# Patient Record
Sex: Male | Born: 1995 | Race: White | Hispanic: No | Marital: Single | Smoking: Never smoker
Health system: Southern US, Community
[De-identification: ages and names within clinical notes are randomized; demographics above are authoritative.]

## PROBLEM LIST (undated history)

## (undated) DIAGNOSIS — F909 Attention-deficit hyperactivity disorder, unspecified type: Secondary | ICD-10-CM

## (undated) DIAGNOSIS — F649 Gender identity disorder, unspecified: Secondary | ICD-10-CM

## (undated) DIAGNOSIS — F419 Anxiety disorder, unspecified: Secondary | ICD-10-CM

## (undated) HISTORY — DX: Gender identity disorder, unspecified: F64.9

## (undated) HISTORY — DX: Attention-deficit hyperactivity disorder, unspecified type: F90.9

## (undated) HISTORY — PX: MASTECTOMY: SHX3

## (undated) HISTORY — DX: Anxiety disorder, unspecified: F41.9

---

## 2018-05-21 ENCOUNTER — Telehealth: Payer: Self-pay | Admitting: Family Medicine

## 2018-05-21 NOTE — Telephone Encounter (Signed)
Tony RushingSeth recently changed name from Maralyn SagoSarah to  SiloamSeth. He is wanting an appt with dr neal to begin testerone treatments. Please advise if dr neal will accept [place] as a pt

## 2018-05-21 NOTE — Telephone Encounter (Signed)
Dear Tony AstersWhite Team Please schedule Tony RushingSeth at next available slot in my clinic ---or you can schedule in colpo clinic is I am in that clinic [facility]! Denny LevySara Jazzmine Sharp

## 2018-05-22 NOTE — Telephone Encounter (Signed)
Contacted pt and [facility] Dr. Jennette KettleNeal, per Dr. Jennette KettleNeal. Joycelyn ManZimmerman Rumple, April D, CMA

## 2018-05-23 ENCOUNTER — Other Ambulatory Visit: Payer: Self-pay

## 2018-05-23 ENCOUNTER — Ambulatory Visit (INDEPENDENT_AMBULATORY_CARE_PROVIDER_SITE_OTHER): Payer: BLUE CROSS/BLUE SHIELD | Admitting: Family Medicine

## 2018-05-23 VITALS — BP 98/58 | HR 80 | Temp 97.8°F | Ht 67.0 in | Wt 173.0 lb

## 2018-05-23 DIAGNOSIS — F640 Transsexualism: Secondary | ICD-10-CM | POA: Diagnosis not present

## 2018-05-23 DIAGNOSIS — Z789 Other specified health status: Secondary | ICD-10-CM

## 2018-05-23 MED ORDER — SYRINGE (DISPOSABLE) 1 ML MISC: 2 refills | Status: DC

## 2018-05-23 MED ORDER — "NEEDLE (DISP) 25G X 5/8"" MISC": 3 refills | Status: DC

## 2018-05-23 MED ORDER — TESTOSTERONE CYPIONATE 200 MG/ML IM SOLN: INTRAMUSCULAR | 0 refills | Status: DC

## 2018-05-23 MED ORDER — "NEEDLE (DISP) 18G X 1-1/2"" MISC": 1 refills | Status: DC

## 2018-05-23 NOTE — Patient Instructions (Signed)
Great to meet you Let me see you in about 4 weeks. Please call with any problems or issues.

## 2018-05-24 DIAGNOSIS — Z789 Other specified health status: Secondary | ICD-10-CM | POA: Insufficient documentation

## 2018-05-24 DIAGNOSIS — F640 Transsexualism: Secondary | ICD-10-CM | POA: Insufficient documentation

## 2018-05-24 LAB — CMP14+EGFR
ALT: 18 IU/L (ref 0–32)
AST: 19 IU/L (ref 0–40)
Albumin/Globulin Ratio: 2.1 (ref 1.2–2.2)
Albumin: 4.7 g/dL (ref 3.5–5.5)
Alkaline Phosphatase: 56 IU/L (ref 39–117)
BUN/Creatinine Ratio: 11 (ref 9–23)
BUN: 7 mg/dL (ref 6–20)
Bilirubin Total: 0.3 mg/dL (ref 0.0–1.2)
CO2: 22 mmol/L (ref 20–29)
Calcium: 9.1 mg/dL (ref 8.7–10.2)
Chloride: 102 mmol/L (ref 96–106)
Creatinine, Ser: 0.62 mg/dL (ref 0.57–1.00)
GFR calc Af Amer: 149 mL/min/{1.73_m2} (ref >59)
GFR calc non Af Amer: 129 mL/min/{1.73_m2} (ref >59)
Globulin, Total: 2.2 g/dL (ref 1.5–4.5)
Glucose: 82 mg/dL (ref 65–99)
Potassium: 4 mmol/L (ref 3.5–5.2)
Sodium: 139 mmol/L (ref 134–144)
Total Protein: 6.9 g/dL (ref 6.0–8.5)

## 2018-05-24 LAB — LIPID PANEL
Chol/HDL Ratio: 2.8 ratio (ref 0.0–4.4)
Cholesterol, Total: 121 mg/dL (ref 100–199)
HDL: 44 mg/dL (ref >39)
LDL Calculated: 67 mg/dL (ref 0–99)
Triglycerides: 48 mg/dL (ref 0–149)
VLDL Cholesterol Cal: 10 mg/dL (ref 5–40)

## 2018-05-24 LAB — CBC
Hematocrit: 38.4 % (ref 34.0–46.6)
Hemoglobin: 13.1 g/dL (ref 11.1–15.9)
MCH: 30.4 pg (ref 26.6–33.0)
MCHC: 34.1 g/dL (ref 31.5–35.7)
MCV: 89 fL (ref 79–97)
Platelets: 336 10*3/uL (ref 150–450)
RBC: 4.31 x10E6/uL (ref 3.77–5.28)
RDW: 13.4 % (ref 12.3–15.4)
WBC: 4 10*3/uL (ref 3.4–10.8)

## 2018-05-24 LAB — ESTRADIOL: ESTRADIOL: 25.1 pg/mL

## 2018-05-24 NOTE — Assessment & Plan Note (Signed)
Long discussion regarding options.  He is definitely ready to initiate testosterone therapy.  We will get baseline labs today.  We instructed him in how to perform subcutaneous injection testosterone.  Prescriptions given.  Follow-up 1 month.

## 2018-05-24 NOTE — Progress Notes (Signed)
    CHIEF COMPLAINT / HPI:  new patient here for hormone therapy discussion and initiation for transgender. Has been in transition since 22 years of age but has not yet initiated any type of hormone therapy secondary to parental pressure.  He has been living with his family until recently when he went to college.  He is currently living with his boyfriend.  He is still on his parents insurance.  They have had many discussions and he says on the surface they are in agreement and understanding of his transition but they have been very reluctant for him to initiate hormone therapy.  He has decided it is definitely time.  He has been in counseling and therapy for several years.  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  This is been stable over the last few years.  He is doing well in school.  He is in a stable relationship.  REVIEW OF SYSTEMS: No fever, no unusual weight change.  No suicidal or homicidal ideation.  Stable.  PERTINENT  PMH / PSH: I have reviewed the patient's medications, allergies, past medical and surgical history, smoking status and updated in the EMR as appropriate.   OBJECTIVE:  Vital signs reviewed. GENERAL: Well-developed, well-nourished, no acute distress. CARDIOVASCULAR: Regular rate and rhythm no murmur gallop or rub LUNGS: Clear to auscultation bilaterally, no rales or wheeze. ABDOMEN: Soft positive bowel sounds NEURO: No gross focal neurological deficits. MSK: Movement of extremity x 4.    ASSESSMENT / PLAN: Please see problem oriented charting for details

## 2018-05-28 ENCOUNTER — Telehealth: Payer: Self-pay

## 2018-05-28 NOTE — Telephone Encounter (Signed)
Depo-Testosterone denied per CoverMyMeds:  "You do not meet the requirements of your plan. 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 Your request has been denied based on the information we have."  Letter in Dr. Donnetta HailNeal's box for review.  Ples SpecterAlisa Micharl Helmes, RN [facility] RN)

## 2018-05-28 NOTE — Telephone Encounter (Signed)
[facility] requesting prior authorization of Depo-Testosterone. Clinical notes uploaded via CoverMyMeds. Status-pending. Ples SpecterAlisa Tanita Palinkas, RN [facility])

## 2018-05-29 ENCOUNTER — Encounter: Payer: Self-pay | Admitting: Family Medicine

## 2018-06-19 ENCOUNTER — Encounter: Payer: Self-pay | Admitting: Family Medicine

## 2018-06-19 ENCOUNTER — Other Ambulatory Visit: Payer: Self-pay

## 2018-06-19 ENCOUNTER — Ambulatory Visit (INDEPENDENT_AMBULATORY_CARE_PROVIDER_SITE_OTHER): Payer: BLUE CROSS/BLUE SHIELD | Admitting: Family Medicine

## 2018-06-19 VITALS — BP 110/66 | HR 80 | Temp 98.5°F | Ht 67.0 in | Wt 174.6 lb

## 2018-06-19 DIAGNOSIS — F640 Transsexualism: Secondary | ICD-10-CM

## 2018-06-19 DIAGNOSIS — Z789 Other specified health status: Secondary | ICD-10-CM

## 2018-06-19 NOTE — Assessment & Plan Note (Signed)
Lab work today.  Will notify him of any changes needed for dosing.  Follow-up 3 m

## 2018-06-19 NOTE — Patient Instructions (Signed)
Great to see you! I will let you know about your labs. See me in about 2 months.

## 2018-06-19 NOTE — Progress Notes (Signed)
    CHIEF COMPLAINT / HPI: Follow-up starting testosterone.  Mild increase in acne on the face.  Mood has been a little bit up-and-down but nothing significant.  No problems with sleep other than baseline issues.  REVIEW OF SYSTEMS: No chest pain, no shortness of breath.  No lower extremity edema.  PERTINENT  PMH / PSH: I have reviewed the patient's medications, allergies, past medical and surgical history, smoking status and updated in the EMR as appropriate.   OBJECTIVE:  Vital signs reviewed. GENERAL: Well-developed, well-nourished, no acute distress. CARDIOVASCULAR: Regular rate and rhythm no murmur gallop or rub LUNGS: Clear to auscultation bilaterally, no rales or wheeze. ABDOMEN: Soft positive bowel sounds NEURO: No gross focal neurological deficits. MSK: Movement of extremity x 4.  Normal strength. SKIN: Mild acne on the forehead.    ASSESSMENT / PLAN: Please see problem oriented charting for details

## 2018-06-20 LAB — CBC
HEMATOCRIT: 38.6 % (ref 34.0–46.6)
HEMOGLOBIN: 12.8 g/dL (ref 11.1–15.9)
MCH: 29.7 pg (ref 26.6–33.0)
MCHC: 33.2 g/dL (ref 31.5–35.7)
MCV: 90 fL (ref 79–97)
Platelets: 327 10*3/uL (ref 150–450)
RBC: 4.31 x10E6/uL (ref 3.77–5.28)
RDW: 12 % — ABNORMAL LOW (ref 12.3–15.4)
WBC: 4.4 10*3/uL (ref 3.4–10.8)

## 2018-06-20 LAB — ESTRADIOL: Estradiol: 74.1 pg/mL

## 2018-06-20 LAB — TESTOSTERONE: Testosterone: 470 ng/dL — ABNORMAL HIGH (ref 8–48)

## 2018-06-21 ENCOUNTER — Telehealth: Payer: Self-pay | Admitting: Family Medicine

## 2018-06-21 DIAGNOSIS — F640 Transsexualism: Secondary | ICD-10-CM

## 2018-06-21 DIAGNOSIS — Z789 Other specified health status: Secondary | ICD-10-CM

## 2018-06-21 MED ORDER — TESTOSTERONE CYPIONATE 200 MG/ML IM SOLN: INTRAMUSCULAR | 0 refills | Status: DC

## 2018-06-21 NOTE — Telephone Encounter (Signed)
[phone] (H) Increase testosterone to 35 mg weekly Left vm mssg Denny LevySara Senica Crall  recheck 4 weeks

## 2018-07-16 ENCOUNTER — Other Ambulatory Visit: Payer: Self-pay | Admitting: *Deleted

## 2018-07-16 ENCOUNTER — Telehealth: Payer: Self-pay | Admitting: *Deleted

## 2018-07-16 DIAGNOSIS — Z789 Other specified health status: Secondary | ICD-10-CM

## 2018-07-16 DIAGNOSIS — F640 Transsexualism: Secondary | ICD-10-CM

## 2018-07-16 MED ORDER — TESTOSTERONE CYPIONATE 200 MG/ML IM SOLN: INTRAMUSCULAR | 3 refills | Status: DC

## 2018-07-16 NOTE — Telephone Encounter (Signed)
Received fax from pharmacy, PA needed on testosterone injection.  Clinical questions submitted via Cover My Meds.  Waiting on response, could take up to 72 hours.  Cover My Meds info: Key: [id number]  Odeal Welden, Maryjo RochesterJessica Dawn, CMA

## 2018-07-17 NOTE — Telephone Encounter (Signed)
Request Denied, see below:

## 2018-07-23 ENCOUNTER — Telehealth: Payer: Self-pay

## 2018-07-23 DIAGNOSIS — F640 Transsexualism: Secondary | ICD-10-CM

## 2018-07-23 DIAGNOSIS — Z789 Other specified health status: Secondary | ICD-10-CM

## 2018-07-23 MED ORDER — TESTOSTERONE CYPIONATE 200 MG/ML IM SOLN
INTRAMUSCULAR | 3 refills | Status: DC
Start: 2018-07-23 — End: 2019-02-17

## 2018-07-23 NOTE — Telephone Encounter (Signed)
Pt called nurse line stating his [facility], [facility] [address] [facility] medication availiable. Since this is a controlled substance, [facility]

## 2018-10-01 ENCOUNTER — Encounter: Payer: Self-pay | Admitting: Family Medicine

## 2018-10-01 ENCOUNTER — Ambulatory Visit (INDEPENDENT_AMBULATORY_CARE_PROVIDER_SITE_OTHER): Payer: BLUE CROSS/BLUE SHIELD | Admitting: Family Medicine

## 2018-10-01 VITALS — BP 128/72 | HR 94 | Temp 97.9°F | Ht 67.0 in | Wt 180.8 lb

## 2018-10-01 DIAGNOSIS — Z23 Encounter for immunization: Secondary | ICD-10-CM

## 2018-10-01 DIAGNOSIS — Z5181 Encounter for therapeutic drug level monitoring: Secondary | ICD-10-CM | POA: Diagnosis not present

## 2018-10-01 NOTE — Patient Instructions (Signed)
It was wonderful to see you today.  Thank you for choosing [facility].   Please call [phone] with any questions about today's appointment.  Please be sure to schedule follow up at the front  desk before you leave today. Follow up in 3 months.   Terisa Starr, MD  Family Medicine

## 2018-10-01 NOTE — Progress Notes (Signed)
  Patient Name: Tony Sharp Date of Birth: 06/23/1996 Date of Visit: 10/01/18 PCP: Nestor Ramp, MD  Chief Complaint: Monitoring of prescription medication  Subjective: Tony Sharp is a pleasant 22 y.o. year old presenting today for monitoring of testosterone therapy.  He reports overall he is doing well.  He is in his senior year at [employer].  He is studying psychology and theater (IT trainer).  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  He graduates this spring he is unsure what he wants to do after graduation quite yet.  In terms of the testosterone therapy Hulen pleased with his response so far.  He does still have some menstrual bleeding.  He has noticed changes in his voice as well as development of worsening acne.  He has been meeting his goals in terms of his transgender care he reports.  He is interested in top surgery in the future.  Currently he bindsup to 3 hours/day. He does not bind while he is at work.  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  Status is not sexually active currently.  He has never had intercourse.  We did discuss a Pap smear today in the future.  He will consider this.  He is not interested in testing for sexually transmitted infections or contraception given that he is not sexually active.  He describes himself as asexual. ROS:  ROS Negative for headaches, chest pain, difficulty breathing, constipation.  Positive for acne I have reviewed the patient's medical, surgical, family, and social history as appropriate.   Vitals:   10/01/18 1335  BP: 128/72  Pulse: 94  Temp: 97.9 F (36.6 C)  SpO2: 99%   Filed Weights   10/01/18 1335  Weight: 180 lb 12.8 oz (82 kg)   HEENT: Sclera anicteric. Dentition is moderate. Appears well hydrated. Neck: Supple Cardiac: Regular rate and rhythm. Normal S1/S2. No murmurs, rubs, or gallops appreciated. Lungs: Clear bilaterally to ascultation.  Skin: Warm, dry comedonal acne present Psych:  Pleasant and appropriate   Diagnoses and all orders for this visit:  Medication monitoring encounter -     Testosterone -     Comprehensive metabolic panel -     CBC  Need for immunization against influenza -     Flu Vaccine QUAD 36+ mos IM  HCM The patient does not currently have a primary care provider.  He would like to have Dr. Julienne Kass or the primary care provider.  We discussed healthcare maintenance including a Pap smear today.  He declined this as well as testing for sexually transmitted infections.  Terisa Starr, MD  Family Medicine Teaching Service

## 2018-10-02 ENCOUNTER — Telehealth: Payer: Self-pay | Admitting: Family Medicine

## 2018-10-02 LAB — COMPREHENSIVE METABOLIC PANEL
ALT: 13 IU/L (ref 0–32)
AST: 16 IU/L (ref 0–40)
Albumin/Globulin Ratio: 2.1 (ref 1.2–2.2)
Albumin: 5 g/dL (ref 3.5–5.5)
Alkaline Phosphatase: 76 IU/L (ref 39–117)
BUN/Creatinine Ratio: 10 (ref 9–23)
BUN: 9 mg/dL (ref 6–20)
Bilirubin Total: 0.4 mg/dL (ref 0.0–1.2)
CALCIUM: 9.7 mg/dL (ref 8.7–10.2)
CO2: 24 mmol/L (ref 20–29)
Chloride: 99 mmol/L (ref 96–106)
Creatinine, Ser: 0.86 mg/dL (ref 0.57–1.00)
GFR, EST AFRICAN AMERICAN: 112 mL/min/{1.73_m2} (ref >59)
GFR, EST NON AFRICAN AMERICAN: 97 mL/min/{1.73_m2} (ref >59)
GLUCOSE: 96 mg/dL (ref 65–99)
Globulin, Total: 2.4 g/dL (ref 1.5–4.5)
POTASSIUM: 4.3 mmol/L (ref 3.5–5.2)
Sodium: 139 mmol/L (ref 134–144)
TOTAL PROTEIN: 7.4 g/dL (ref 6.0–8.5)

## 2018-10-02 LAB — TESTOSTERONE: TESTOSTERONE: 422 ng/dL — AB (ref 8–48)

## 2018-10-02 LAB — CBC
HEMATOCRIT: 41.4 % (ref 34.0–46.6)
HEMOGLOBIN: 13.7 g/dL (ref 11.1–15.9)
MCH: 29.2 pg (ref 26.6–33.0)
MCHC: 33.1 g/dL (ref 31.5–35.7)
MCV: 88 fL (ref 79–97)
Platelets: 323 10*3/uL (ref 150–450)
RBC: 4.69 x10E6/uL (ref 3.77–5.28)
RDW: 13.1 % (ref 12.3–15.4)
WBC: 7.8 10*3/uL (ref 3.4–10.8)

## 2018-10-02 NOTE — Telephone Encounter (Signed)
Patient aware. Alisa Brake, RN ([facility] RN)  

## 2018-10-02 NOTE — Telephone Encounter (Signed)
Called patient with results. Left HIPAA Compliant voice mail.   If calls back, labs are within normal limits. Would continue current dose of medication- levels are in perfect range.

## 2019-02-17 ENCOUNTER — Other Ambulatory Visit: Payer: Self-pay

## 2019-02-17 ENCOUNTER — Encounter: Payer: Self-pay | Admitting: Family Medicine

## 2019-02-17 ENCOUNTER — Ambulatory Visit (INDEPENDENT_AMBULATORY_CARE_PROVIDER_SITE_OTHER): Payer: BLUE CROSS/BLUE SHIELD | Admitting: Family Medicine

## 2019-02-17 VITALS — BP 110/72 | HR 93 | Temp 98.5°F | Wt 188.0 lb

## 2019-02-17 DIAGNOSIS — Z789 Other specified health status: Secondary | ICD-10-CM

## 2019-02-17 DIAGNOSIS — F640 Transsexualism: Secondary | ICD-10-CM | POA: Diagnosis not present

## 2019-02-17 DIAGNOSIS — Z5181 Encounter for therapeutic drug level monitoring: Secondary | ICD-10-CM

## 2019-02-17 MED ORDER — TESTOSTERONE CYPIONATE 200 MG/ML IM SOLN: INTRAMUSCULAR | 3 refills | Status: DC

## 2019-02-17 MED ORDER — TESTOSTERONE CYPIONATE 200 MG/ML IM SOLN
INTRAMUSCULAR | 3 refills | Status: DC
Start: 2019-02-17 — End: 2019-02-17

## 2019-02-17 NOTE — Patient Instructions (Addendum)
It was wonderful to see you today.  Thank you for choosing [facility].   Please call [phone] with any questions about today's appointment.  Please be sure to schedule follow up at the front  desk before you leave today.   Terisa Starr, MD  Family Medicine   Please schedule follow up in 6 months  I will call you with labs  Thank you for seeing me today!!

## 2019-02-17 NOTE — Progress Notes (Signed)
  Patient Name: Tony Sharp Date of Birth: Dec 18, 1996 Date of Visit: 02/17/19 PCP: Nestor Ramp, MD  Chief Complaint: medication monitoring   Subjective: Tony Sharp is a pleasant 23 y.o. presenting today for medication refill.   Brain reports he will graduate in May. Just returned from a job fair. 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   Patient reports he missed a dose of testosterone and got his period. He has had no other issues. No problems with cost, headaches, side effects. He is only binding ~10 hours per week now. Not sexually active. No tobacco or excess alcohol use.   In addition to testosterone, he takes Buspar, Cymbalta, and Strattera.   ROS: as above.  ROS  I have reviewed the patient's medical, surgical, family, and social history as appropriate.   Vitals:   02/17/19 1033  BP: 110/72  Pulse: 93  Temp: 98.5 F (36.9 C)  SpO2: 97%   Filed Weights   02/17/19 1033  Weight: 188 lb (85.3 kg)   HEENT: Sclera anicteric. Dentition is moderate. Appears well hydrated. Neck: Supple Cardiac: Regular rate and rhythm. Normal S1/S2. No murmurs, rubs, or gallops appreciated. Lungs: Clear bilaterally to ascultation.  Abdomen: Normoactive bowel sounds. No tenderness to deep or light palpation. No rebound or guarding.  Extremities: Warm, well perfused without edema.  Skin: Warm, dry, moderate acne  Psych: Pleasant and appropriate   [name] was seen today for testosterone check.  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 Reviewed possible long term side effects. Reviewed monitoring parameters below. Recommended Pap and STI testing in future, the patient will think about this.  -     Comprehensive metabolic panel -     CBC -     Testosterone -     testosterone cypionate (DEPOTESTOSTERONE CYPIONATE) 200 MG/ML injection; Inject 35 mg subcutaneously each  week  Terisa Starr, MD  [facility] Medicine Teaching Service

## 2019-02-17 NOTE — Assessment & Plan Note (Signed)
Injects on Wednesday---will check levels today. Continue current dose, monitoring labs ordered.

## 2019-02-18 ENCOUNTER — Telehealth: Payer: Self-pay | Admitting: Family Medicine

## 2019-02-18 DIAGNOSIS — Z789 Other specified health status: Secondary | ICD-10-CM

## 2019-02-18 DIAGNOSIS — F640 Transsexualism: Secondary | ICD-10-CM

## 2019-02-18 LAB — CBC
Hematocrit: 42.1 % (ref 34.0–46.6)
Hemoglobin: 14.3 g/dL (ref 11.1–15.9)
MCH: 30 pg (ref 26.6–33.0)
MCHC: 34 g/dL (ref 31.5–35.7)
MCV: 88 fL (ref 79–97)
Platelets: 338 10*3/uL (ref 150–450)
RBC: 4.76 x10E6/uL (ref 3.77–5.28)
RDW: 13.2 % (ref 11.7–15.4)
WBC: 5 10*3/uL (ref 3.4–10.8)

## 2019-02-18 LAB — COMPREHENSIVE METABOLIC PANEL
ALBUMIN: 4.8 g/dL (ref 3.9–5.0)
ALT: 13 IU/L (ref 0–32)
AST: 20 IU/L (ref 0–40)
Albumin/Globulin Ratio: 2 (ref 1.2–2.2)
Alkaline Phosphatase: 78 IU/L (ref 39–117)
BUN / CREAT RATIO: 12 (ref 9–23)
BUN: 11 mg/dL (ref 6–20)
Bilirubin Total: 0.3 mg/dL (ref 0.0–1.2)
CALCIUM: 9.5 mg/dL (ref 8.7–10.2)
CO2: 23 mmol/L (ref 20–29)
CREATININE: 0.9 mg/dL (ref 0.57–1.00)
Chloride: 100 mmol/L (ref 96–106)
GFR calc Af Amer: 106 mL/min/{1.73_m2} (ref >59)
GFR, EST NON AFRICAN AMERICAN: 92 mL/min/{1.73_m2} (ref >59)
GLUCOSE: 72 mg/dL (ref 65–99)
Globulin, Total: 2.4 g/dL (ref 1.5–4.5)
Potassium: 4.9 mmol/L (ref 3.5–5.2)
Sodium: 141 mmol/L (ref 134–144)
Total Protein: 7.2 g/dL (ref 6.0–8.5)

## 2019-02-18 LAB — TESTOSTERONE: Testosterone: 319 ng/dL — ABNORMAL HIGH (ref 8–48)

## 2019-02-18 MED ORDER — TESTOSTERONE CYPIONATE 200 MG/ML IM SOLN: INTRAMUSCULAR | 3 refills | Status: DC

## 2019-02-18 NOTE — Telephone Encounter (Signed)
Called patient regarding results. Total testosterone below desired range. Discussed with patient, will increase to 40 mg weekly (injects on Wednesday, new dose 0.2 mL weekly).   Will recheck level in 3 months.  All questions answered. New Rx to [facility].   Terisa Starr, MD  Family Medicine Teaching Service

## 2019-03-03 ENCOUNTER — Telehealth: Payer: Self-pay

## 2019-03-03 MED ORDER — DULOXETINE HCL 60 MG PO CPEP: 60.0000 mg | ORAL_CAPSULE | Freq: Every day | ORAL | 0 refills | Status: DC

## 2019-03-03 NOTE — Telephone Encounter (Signed)
Called patient to check in--he is doing okay. Rx sent to pharmacy.   Terisa Starr, MD  Family Medicine Teaching Service

## 2019-03-03 NOTE — Telephone Encounter (Signed)
Pt called nurse line stating his phychiatrist has recently switched practices and with COVID 19, he is not wanting to go to a new place right now. Pt is requesting a temporary prescriptions for his Cymbalta generic 60mg . Pt stated she only has #15 left as of today. Pt takes this medication 1x daily. Pt does want to see someone else, just right now is not a good time. Please advise.   Pt requests the medication refill be sent to [facility], since he saw her last.

## 2019-04-21 ENCOUNTER — Other Ambulatory Visit: Payer: Self-pay

## 2019-04-22 MED ORDER — DULOXETINE HCL 60 MG PO CPEP: 60.0000 mg | ORAL_CAPSULE | Freq: Every day | ORAL | 0 refills | Status: DC

## 2019-05-16 ENCOUNTER — Telehealth: Payer: Self-pay | Admitting: Family Medicine

## 2019-05-16 NOTE — Telephone Encounter (Signed)
Called patient to check in---- Tony Sharp is due for a testosterone level sometime this month. Left voicemail to call back.   Lab should be collected on either Monday or Friday while fasting.   Terisa Starr, MD  Family Medicine Teaching Service

## 2019-09-15 ENCOUNTER — Other Ambulatory Visit: Payer: Self-pay | Admitting: *Deleted

## 2019-09-15 DIAGNOSIS — F640 Transsexualism: Secondary | ICD-10-CM

## 2019-09-15 DIAGNOSIS — Z789 Other specified health status: Secondary | ICD-10-CM

## 2019-09-15 MED ORDER — TESTOSTERONE CYPIONATE 200 MG/ML IM SOLN: INTRAMUSCULAR | 0 refills | Status: DC

## 2019-09-15 NOTE — Telephone Encounter (Signed)
Pt states that he is out of med and he is afraid that his testosterone level will be off when he come to have lab work next week.  Christen Bame, CMA

## 2019-09-15 NOTE — Telephone Encounter (Signed)
I refilled meds Come to appt anyway THANKS! Dorcas Mcmurray

## 2019-09-15 NOTE — Addendum Note (Signed)
Addended byDorcas Mcmurray L on: 09/15/2019 03:55 PM   Modules accepted: Orders

## 2019-09-15 NOTE — Telephone Encounter (Signed)
Pt informed of below.Tony Sharp, CMA ? ?

## 2019-09-18 ENCOUNTER — Telehealth: Payer: Self-pay | Admitting: *Deleted

## 2019-09-18 NOTE — Telephone Encounter (Signed)
Received fax from pharmacy, PA needed on testosterone injection.  Clinical questions submitted via Cover My Meds.  Waiting on response, could take up to 72 hours.  Cover My Meds info: Key: [id number]  Christen Bame, CMA

## 2019-09-22 ENCOUNTER — Ambulatory Visit: Payer: BLUE CROSS/BLUE SHIELD | Admitting: Family Medicine

## 2019-09-22 ENCOUNTER — Other Ambulatory Visit: Payer: Self-pay

## 2019-09-22 ENCOUNTER — Encounter: Payer: Self-pay | Admitting: Family Medicine

## 2019-09-22 VITALS — BP 144/82 | HR 110

## 2019-09-22 DIAGNOSIS — Z5181 Encounter for therapeutic drug level monitoring: Secondary | ICD-10-CM

## 2019-09-22 DIAGNOSIS — Z23 Encounter for immunization: Secondary | ICD-10-CM | POA: Diagnosis not present

## 2019-09-22 DIAGNOSIS — F640 Transsexualism: Secondary | ICD-10-CM

## 2019-09-22 NOTE — Patient Instructions (Signed)
It was wonderful to see you today.  Thank you for choosing [facility].   Please call [phone] with any questions about today's appointment.  Please be sure to schedule follow up at the front  desk before you leave today.   Dorris Singh, MD  Family Medicine    I will call you with lab results

## 2019-09-22 NOTE — Progress Notes (Addendum)
  Patient Name: Tony Sharp Date of Birth: Jun 19, 1996 Date of Visit: 09/22/19 PCP: Dickie La, MD  Chief Complaint: Medication refill  Subjective: Tony Sharp is a pleasant 23 y.o. 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  Patient over all reports he is doing well.  Since I last saw him in the late winter he has obtained a job at a [employer].  He likes working there, most of the time outside  He says his most bothersome concern currently is his voice.  He constantly is Ms. gendered on the phone.  Additionally he describes his body is "too curvy".  He has not had any side effects from the testosterone.  He is growing facial hair he has noticed a deepening of his voice but wishes to have more changed with this.  He denies any spotting headaches, chest pain.  He does have some mild acne but he reports this is not bothersome.  The patient reports his mood is overall doing well.  He is in an established relationship with Hartford Poli (his partner of 18 months).  He lives with 2 roommates.  He feels safe where he lives and likes his job currently.    ROS: Per HPI.   I have reviewed the patient's medical, surgical, family, and social history as appropriate.  Vitals:   09/22/19 0854  BP: (!) 144/82  Pulse: (!) 110  SpO2: 98%   BP Repeat 122/80 HR 96   HEENT: Sclera anicteric. Dentition is moderate. Appears well hydrated. Neck: Supple Cardiac: Regular rate and rhythm. Normal S1/S2. No murmurs, rubs, or gallops appreciated. Lungs: Clear bilaterally to ascultation.  Psych: Pleasant and appropriate, voice does have feminine overtones after talking for longer sentences.  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  The patient would like deeper voice and and more masculine body features.  Blood pressure on repeat was within normal limits.  Will obtain labs to evaluate  for potential side effects.  He currently takes 40 mg typically on Fridays although he most recently injected last Monday which was the 28th. At follow-up consider further discussion of Pap CBR.  As well we will further discuss that surgical interventions (mastectomy or breast reduction) may help patient achieve goals Offered STI screening, declined.   Return to care in 3 months.   Dorris Singh, MD  Family Medicine Teaching Service

## 2019-09-22 NOTE — Assessment & Plan Note (Signed)
Monitoring labs obtained today.  The patient would like deeper voice and and more masculine body features.  Blood pressure on repeat was within normal limits.  Will obtain labs to evaluate for potential side effects.  He currently takes 40 mg typically on Fridays although he most recently injected last Monday which was the 28th.

## 2019-09-23 LAB — ESTRADIOL: Estradiol: 30.5 pg/mL

## 2019-09-23 LAB — COMPREHENSIVE METABOLIC PANEL
ALT: 16 IU/L (ref 0–32)
AST: 19 IU/L (ref 0–40)
Albumin/Globulin Ratio: 1.9 (ref 1.2–2.2)
Albumin: 4.7 g/dL (ref 3.9–5.0)
Alkaline Phosphatase: 112 IU/L (ref 39–117)
BUN/Creatinine Ratio: 11 (ref 9–23)
BUN: 10 mg/dL (ref 6–20)
Bilirubin Total: 0.2 mg/dL (ref 0.0–1.2)
CO2: 23 mmol/L (ref 20–29)
Calcium: 9.5 mg/dL (ref 8.7–10.2)
Chloride: 101 mmol/L (ref 96–106)
Creatinine, Ser: 0.87 mg/dL (ref 0.57–1.00)
GFR calc Af Amer: 109 mL/min/{1.73_m2} (ref >59)
GFR calc non Af Amer: 95 mL/min/{1.73_m2} (ref >59)
Globulin, Total: 2.5 g/dL (ref 1.5–4.5)
Glucose: 66 mg/dL (ref 65–99)
Potassium: 4.4 mmol/L (ref 3.5–5.2)
Sodium: 140 mmol/L (ref 134–144)
Total Protein: 7.2 g/dL (ref 6.0–8.5)

## 2019-09-23 LAB — TESTOSTERONE: Testosterone: 177 ng/dL — ABNORMAL HIGH (ref 8–48)

## 2019-09-23 LAB — LIPID PANEL
Chol/HDL Ratio: 3.4 ratio (ref 0.0–4.4)
Cholesterol, Total: 158 mg/dL (ref 100–199)
HDL: 46 mg/dL (ref >39)
LDL Chol Calc (NIH): 93 mg/dL (ref 0–99)
Triglycerides: 101 mg/dL (ref 0–149)
VLDL Cholesterol Cal: 19 mg/dL (ref 5–40)

## 2019-09-23 LAB — CBC
Hematocrit: 44.7 % (ref 34.0–46.6)
Hemoglobin: 15 g/dL (ref 11.1–15.9)
MCH: 30.2 pg (ref 26.6–33.0)
MCHC: 33.6 g/dL (ref 31.5–35.7)
MCV: 90 fL (ref 79–97)
Platelets: 325 10*3/uL (ref 150–450)
RBC: 4.97 x10E6/uL (ref 3.77–5.28)
RDW: 13.3 % (ref 11.7–15.4)
WBC: 5.6 10*3/uL (ref 3.4–10.8)

## 2019-09-24 ENCOUNTER — Telehealth: Payer: Self-pay | Admitting: Family Medicine

## 2019-09-24 DIAGNOSIS — F640 Transsexualism: Secondary | ICD-10-CM

## 2019-09-24 DIAGNOSIS — F649 Gender identity disorder, unspecified: Secondary | ICD-10-CM

## 2019-09-24 DIAGNOSIS — Z789 Other specified health status: Secondary | ICD-10-CM

## 2019-09-24 NOTE — Telephone Encounter (Signed)
Attempted to call with results. Will increase testosterone to 50 mg.   Dorris Singh, MD  Family Medicine Teaching Service

## 2019-09-25 MED ORDER — TESTOSTERONE CYPIONATE 200 MG/ML IM SOLN: INTRAMUSCULAR | 0 refills | Status: DC

## 2019-09-25 NOTE — Telephone Encounter (Signed)
Call patient regarding blood work.  Testosterone is low.  Increase to 50 mg once a week.  Will recheck labs again November.  All questions were answered

## 2019-09-25 NOTE — Telephone Encounter (Signed)
Pt LVM on nurse line calling for Dr. Owens Shark. Pt received message about increasing medication but would like to talk to Dr. Owens Shark. Please call [phone]. Ottis Stain, CMA

## 2019-09-30 ENCOUNTER — Telehealth: Payer: Self-pay

## 2019-09-30 NOTE — Telephone Encounter (Signed)
Received fax from [facility] requesting prior authorization for Testosterone. Clincal questions placed in providers box for completion. Please return to RN clinic once completed.   Covermymeds Key: [id number]

## 2019-10-24 ENCOUNTER — Other Ambulatory Visit: Payer: BC Managed Care – PPO

## 2019-10-24 ENCOUNTER — Other Ambulatory Visit: Payer: Self-pay

## 2019-10-24 DIAGNOSIS — F649 Gender identity disorder, unspecified: Secondary | ICD-10-CM

## 2019-10-25 LAB — TESTOSTERONE: Testosterone: 418 ng/dL — ABNORMAL HIGH (ref 8–48)

## 2019-11-16 ENCOUNTER — Encounter: Payer: Self-pay | Admitting: Family Medicine

## 2019-11-16 DIAGNOSIS — F649 Gender identity disorder, unspecified: Secondary | ICD-10-CM

## 2019-11-17 MED ORDER — TESTOSTERONE CYPIONATE 200 MG/ML IM SOLN: INTRAMUSCULAR | 0 refills | Status: DC

## 2020-02-21 ENCOUNTER — Ambulatory Visit: Payer: BC Managed Care – PPO | Attending: Internal Medicine

## 2020-02-21 DIAGNOSIS — Z23 Encounter for immunization: Secondary | ICD-10-CM | POA: Insufficient documentation

## 2020-02-21 NOTE — Progress Notes (Signed)
   Covid-19 Vaccination Clinic  Name:  Tony Sharp    MRN: [id number] DOB: 03/19/1996  02/21/2020  [name] was observed post Covid-19 immunization for 15 minutes without incident. He was provided with Vaccine Information Sheet and instruction to access the V-Safe system.   [name] was instructed to call 911 with any severe reactions post vaccine: Marland Kitchen Difficulty breathing  . Swelling of face and throat  . A fast heartbeat  . A bad rash all over body  . Dizziness and weakness   Immunizations Administered    Name Date Dose VIS Date Route   Pfizer COVID-19 Vaccine 02/21/2020 10:31 AM 0.3 mL 11/28/2019 Intramuscular   Manufacturer: [employer], [employer]   Lot: [id number]   NDC: [address]-4

## 2020-03-16 ENCOUNTER — Ambulatory Visit: Payer: BC Managed Care – PPO | Attending: Internal Medicine

## 2020-03-16 DIAGNOSIS — Z23 Encounter for immunization: Secondary | ICD-10-CM

## 2020-03-16 NOTE — Progress Notes (Signed)
   Covid-19 Vaccination Clinic  Name:  TABARI VOLKERT    MRN: [id number] DOB: 08/01/1996  03/16/2020  Mr. Moradi was observed post Covid-19 immunization for 15 minutes without incident. He was provided with Vaccine Information Sheet and instruction to access the V-Safe system.   Mr. Apo was instructed to call 911 with any severe reactions post vaccine: Marland Kitchen Difficulty breathing  . Swelling of face and throat  . A fast heartbeat  . A bad rash all over body  . Dizziness and weakness   Immunizations Administered    Name Date Dose VIS Date Route   Pfizer COVID-19 Vaccine 03/16/2020  8:17 AM 0.3 mL 11/28/2019 Intramuscular   Manufacturer: [employer], [employer]   Lot: [id number]   NDC: [address]-4

## 2020-04-30 ENCOUNTER — Other Ambulatory Visit: Payer: Self-pay | Admitting: *Deleted

## 2020-04-30 DIAGNOSIS — F649 Gender identity disorder, unspecified: Secondary | ICD-10-CM

## 2020-05-06 MED ORDER — TESTOSTERONE CYPIONATE 200 MG/ML IM SOLN: INTRAMUSCULAR | 0 refills | Status: DC

## 2020-05-07 ENCOUNTER — Telehealth: Payer: Self-pay

## 2020-05-07 NOTE — Telephone Encounter (Signed)
Received fax from pharmacy, PA needed on Testosterone. Clinical questions submitted via Cover My Meds. Waiting on response, could take up to 72 hours.  Cover My Meds info: Key: [id number]

## 2020-05-10 NOTE — Telephone Encounter (Signed)
Medication was denied. Please see below. Typically you should receive a detailed letter with a phone number to appeal this, if you wish to appeal.

## 2020-06-14 ENCOUNTER — Encounter: Payer: Self-pay | Admitting: Family Medicine

## 2020-06-14 ENCOUNTER — Other Ambulatory Visit: Payer: Self-pay

## 2020-06-14 ENCOUNTER — Ambulatory Visit: Payer: BC Managed Care – PPO | Admitting: Family Medicine

## 2020-06-14 VITALS — BP 122/80 | HR 106 | Wt 228.0 lb

## 2020-06-14 DIAGNOSIS — M25562 Pain in left knee: Secondary | ICD-10-CM

## 2020-06-14 DIAGNOSIS — F640 Transsexualism: Secondary | ICD-10-CM | POA: Diagnosis not present

## 2020-06-14 DIAGNOSIS — Z789 Other specified health status: Secondary | ICD-10-CM

## 2020-06-14 DIAGNOSIS — G4700 Insomnia, unspecified: Secondary | ICD-10-CM

## 2020-06-14 DIAGNOSIS — M25862 Other specified joint disorders, left knee: Secondary | ICD-10-CM | POA: Diagnosis not present

## 2020-06-14 DIAGNOSIS — G8929 Other chronic pain: Secondary | ICD-10-CM

## 2020-06-14 NOTE — Progress Notes (Addendum)
    SUBJECTIVE:   CHIEF COMPLAINT / HPI:   Tony Sharp is a 24 y.o. male with a history of anxiety who presents for a testosterone check-up.  He has several concerns today.  He reports lifelong issues with sleep.  He takes melatonin and Unisom every single night.  He typically ends work around 5 PM.  He then goes home gets in bed at 11 PM and tries to fall asleep by midnight.  Oftentimes he does not fall asleep until 5 AM.  When he wakes up at 7 for work.  He drinks 2 cups of caffeine per day.  His room is cool and relatively quiet with lots of white noise.  No TV.  And he tries to stay off his phone.  He has a family history of thyroid disease.  He reports at night oftentimes his legs bother him.  He does not get his menses.  The patient reports he is having medial left knee pain.  This is ongoing for several years.  It is worsened since he is now working [employer].  He denies popping, clicking, catching.  Denies prior injury.  He has tried nothing for this.  He notices it when he is going up and down stairs and when he crosses his leg.  He recently started running again and noticed this worsening.  He denies swelling.  He notices improvement with rest.  The patient is injecting 60 mg of testosterone.  Last injected last Friday.  He has no specific concerns and no side effects.  He would consider Pap smear in the future but not right now.  He is not currently sexually active.  He is undergoing top surgery later this year. Tony Sharp- Dr. Leonor Liv)   PERTINENT  PMH / PSH: Reviewed and updated.   OBJECTIVE:   BP 122/80   Pulse (!) 106   Wt 228 lb (103.4 kg)   SpO2 97%   BMI 35.71 kg/m   HR on exam is 94  HEENT: Sclera anicteric. Dentition is moderate. Appears well hydrated. +moderate acne  Neck: Supple no thyromegaly  Cardiac: Regular rate and rhythm. Normal S1/S2. No murmurs, rubs, or gallops appreciated. Lungs: Clear bilaterally to ascultation.  Knee exam KNEE  Is  ligamentously intact to:        Varus stress        Valgus stress        Anterior drawer / Lachman testing There is NO effusion Gait + intoeing Negative thessaly  No TTP over pes medial bursa PES   ASSESSMENT/PLAN:   Left knee pain Suspect patellofemoral pain syndrome.  Less likely meniscal pathology given examination.  Also considered Pez anserine bursitis but he has no point tenderness over the bursa and examination is not consistent with this.  Intoeing is likely contributing to this could consider orthotics in the future.  Male-to-male transgender person No adverse side effects.  He is planning for top surgery later this year.  Congratulated on this change.  Monitoring labs today.  Insomnia Reviewed sleep hygiene. May represent RLS, less likely. OSA also consider. + FHx of thyroid disease---will check CBC, Ferritin, TSH today. Consider counseling for iCBTin future, query if anxiety contributing. GAD at follow up if continues.    HCM Discussed Pap Already got COVID vaccine Declined HIV and Hep C   [name], MD [facility]

## 2020-06-14 NOTE — Patient Instructions (Addendum)
It was wonderful to see you today.  Please bring ALL of your medications with you to every visit.     Thank you for choosing [facility].   Please call [phone] with any questions about today's appointment.  Please be sure to schedule follow up at the front  desk before you leave today.   Terisa Starr, MD  Family Medicine   I will call your pharmacy about the testosterone  I will call you with labs  Thank you for talking with [place] today  Stretches---- 10 reps of each exercise every single day for 1 week then can go to every other day   Lets follow up in about 4 weeks for your sleeping

## 2020-06-14 NOTE — Assessment & Plan Note (Signed)
No adverse side effects.  He is planning for top surgery later this year.  Congratulated on this change.  Monitoring labs today.

## 2020-06-14 NOTE — Assessment & Plan Note (Signed)
Reviewed sleep hygiene. May represent RLS, less likely. OSA also consider. + FHx of thyroid disease---will check CBC, Ferritin, TSH today. Consider counseling for iCBTin future, query if anxiety contributing. GAD at follow up if continues.

## 2020-06-14 NOTE — Assessment & Plan Note (Signed)
Suspect patellofemoral pain syndrome.  Less likely meniscal pathology given examination.  Also considered Pez anserine bursitis but he has no point tenderness over the bursa and examination is not consistent with this.  Intoeing is likely contributing to this could consider orthotics in the future.

## 2020-06-15 ENCOUNTER — Telehealth: Payer: Self-pay | Admitting: Family Medicine

## 2020-06-15 LAB — CBC
Hematocrit: 47.7 % — ABNORMAL HIGH (ref 34.0–46.6)
Hemoglobin: 16 g/dL — ABNORMAL HIGH (ref 11.1–15.9)
MCH: 30.7 pg (ref 26.6–33.0)
MCHC: 33.5 g/dL (ref 31.5–35.7)
MCV: 91 fL (ref 79–97)
Platelets: 358 10*3/uL (ref 150–450)
RBC: 5.22 x10E6/uL (ref 3.77–5.28)
RDW: 12.7 % (ref 11.7–15.4)
WBC: 6.2 10*3/uL (ref 3.4–10.8)

## 2020-06-15 LAB — COMPREHENSIVE METABOLIC PANEL
ALT: 19 IU/L (ref 0–32)
AST: 20 IU/L (ref 0–40)
Albumin/Globulin Ratio: 1.7 (ref 1.2–2.2)
Albumin: 4.8 g/dL (ref 3.9–5.0)
Alkaline Phosphatase: 129 IU/L — ABNORMAL HIGH (ref 48–121)
BUN/Creatinine Ratio: 15 (ref 9–23)
BUN: 13 mg/dL (ref 6–20)
Bilirubin Total: 0.4 mg/dL (ref 0.0–1.2)
CO2: 21 mmol/L (ref 20–29)
Calcium: 9.6 mg/dL (ref 8.7–10.2)
Chloride: 102 mmol/L (ref 96–106)
Creatinine, Ser: 0.87 mg/dL (ref 0.57–1.00)
GFR calc Af Amer: 109 mL/min/{1.73_m2} (ref >59)
GFR calc non Af Amer: 94 mL/min/{1.73_m2} (ref >59)
Globulin, Total: 2.8 g/dL (ref 1.5–4.5)
Glucose: 75 mg/dL (ref 65–99)
Potassium: 4.5 mmol/L (ref 3.5–5.2)
Sodium: 138 mmol/L (ref 134–144)
Total Protein: 7.6 g/dL (ref 6.0–8.5)

## 2020-06-15 LAB — TESTOSTERONE: Testosterone: 332 ng/dL — ABNORMAL HIGH (ref 13–71)

## 2020-06-15 LAB — FERRITIN: Ferritin: 30 ng/mL (ref 15–150)

## 2020-06-15 LAB — TSH: TSH: 3.31 u[IU]/mL (ref 0.450–4.500)

## 2020-06-15 NOTE — Telephone Encounter (Signed)
Left generic voicemail about normal results, also sent mychart.  Repeat ALP at follow up.  Terisa Starr, MD  Family Medicine Teaching Service

## 2020-07-09 ENCOUNTER — Other Ambulatory Visit: Payer: Self-pay

## 2020-07-09 ENCOUNTER — Ambulatory Visit: Payer: BC Managed Care – PPO | Admitting: Family Medicine

## 2020-07-09 ENCOUNTER — Encounter: Payer: Self-pay | Admitting: Family Medicine

## 2020-07-09 VITALS — BP 108/72 | HR 75 | Ht 67.0 in | Wt 227.0 lb

## 2020-07-09 DIAGNOSIS — R748 Abnormal levels of other serum enzymes: Secondary | ICD-10-CM

## 2020-07-09 DIAGNOSIS — F5101 Primary insomnia: Secondary | ICD-10-CM

## 2020-07-09 DIAGNOSIS — G2581 Restless legs syndrome: Secondary | ICD-10-CM | POA: Diagnosis not present

## 2020-07-09 MED ORDER — GABAPENTIN 100 MG PO CAPS: 100.0000 mg | ORAL_CAPSULE | Freq: Every evening | ORAL | 0 refills | Status: DC

## 2020-07-09 NOTE — Assessment & Plan Note (Signed)
Suspect restless leg as cause of symptoms--- evaluation for secondary causes unrevealing. Will complete evaluation with B12 and folate today. Reviewed options, encouraged hydration, trial gabapentin at night.

## 2020-07-09 NOTE — Patient Instructions (Signed)
It was wonderful to see you today.  Please bring ALL of your medications with you to every visit.   Today we talked about:  - Starting gabapentin for your legs  - Checking your liver testing again  Message me in 4-6 weeks about your legs  You can increase the gabapentin to up to 3 tabs at night    Thank you for choosing [facility].   Please call [phone] with any questions about today's appointment.  Please be sure to schedule follow up at the front  desk before you leave today.   Terisa Starr, MD  Family Medicine

## 2020-07-09 NOTE — Progress Notes (Signed)
    SUBJECTIVE:   CHIEF COMPLAINT / HPI:  Tony Sharp is a 24 year old presenting for follow up he has several concerns today.  Leg issue at night  The patient continues to report insomnia.  He reports his legs constantly want to move at night.  He feels like he does has to move his legs every several minutes at night.  He drinks about 8 glasses of water while at work during the day and 1 to 2 cups of caffeine.  He has been trying to establish a sleep hygiene pattern and sleep routine over the last several weeks.  He has been off Unisom and is found his leg symptoms are worsening.  He does not drink alcohol in excess.  His ferritin level is within normal limits.  Elevated alkaline phosphatase The patient had elevated alkaline phosphatase on his last metabolic panel.  He denies excess alcohol use.  He is not taking any medications on a regular basis other than testosterone therapy.  Denies right upper quadrant pain or back pain.  Left knee pain He reports he has been watching this.  He does notice it worsening at times and going up and down stairs is similar going both up and down.  When he crosses his legs.  No numbness or weakness.  He is interested in considering physical therapy in the future and will message about this if he would like to proceed.  PERTINENT  PMH / PSH/Family/Social History : Updated as appropriate  OBJECTIVE:   BP 108/72   Pulse 75   Ht 5\' 7"  (1.702 m)   Wt (!) 227 lb (103 kg)   SpO2 97%   BMI 35.55 kg/m   Cardiac: Regular rate and rhythm. Normal S1/S2. No murmurs, rubs, or gallops appreciated. Lungs: Clear bilaterally to ascultation.      ASSESSMENT/PLAN:   Insomnia Suspect restless leg as cause of symptoms--- evaluation for secondary causes unrevealing. Will complete evaluation with B12 and folate today. Reviewed options, encouraged hydration, trial gabapentin at night.     Elevated ALP Repeat with GGT today.  We discussed etiology at  length   Knee pain, improved, will message if recurs   , MD  Family Medicine Teaching Service  [facility]

## 2020-07-10 LAB — FOLATE: Folate: 13.3 ng/mL (ref >3.0)

## 2020-07-10 LAB — HEPATIC FUNCTION PANEL
ALT: 17 IU/L (ref 0–44)
AST: 18 IU/L (ref 0–40)
Albumin: 4.7 g/dL (ref 4.1–5.2)
Alkaline Phosphatase: 121 IU/L (ref 48–121)
Bilirubin Total: 0.5 mg/dL (ref 0.0–1.2)
Bilirubin, Direct: 0.14 mg/dL (ref 0.00–0.40)
Total Protein: 7.4 g/dL (ref 6.0–8.5)

## 2020-07-10 LAB — GAMMA GT: GGT: 15 IU/L (ref 0–65)

## 2020-07-10 LAB — VITAMIN B12: Vitamin B-12: 202 pg/mL — ABNORMAL LOW (ref 232–1245)

## 2020-07-13 ENCOUNTER — Telehealth: Payer: Self-pay | Admitting: Family Medicine

## 2020-07-13 DIAGNOSIS — E538 Deficiency of other specified B group vitamins: Secondary | ICD-10-CM

## 2020-07-13 MED ORDER — CYANOCOBALAMIN 1000 MCG PO CAPS: ORAL_CAPSULE | ORAL | 3 refills | Status: DC

## 2020-07-13 NOTE — Telephone Encounter (Signed)
Called patient and per request left voicemail with normal results of liver function. Asked him to call back about low B12---also sent MyChart.   Rx already at [facility] one table once per day. Needs repeat level in 6-8 weeks. This can just be lab visit.   Terisa Starr, MD  Family Medicine Teaching Service

## 2020-07-27 ENCOUNTER — Encounter: Payer: Self-pay | Admitting: Family Medicine

## 2020-07-27 MED ORDER — GABAPENTIN 100 MG PO CAPS: 200.0000 mg | ORAL_CAPSULE | Freq: Every evening | ORAL | 2 refills | Status: DC

## 2020-08-06 ENCOUNTER — Telehealth: Payer: Self-pay | Admitting: *Deleted

## 2020-08-06 NOTE — Telephone Encounter (Signed)
Received fax from pharmacy, PA needed on testosterone.  Clinical questions submitted via Cover My Meds.  Waiting on response, could take up to 72 hours.  Cover My Meds info: Key: [id number]  Jone Baseman, CMA

## 2020-08-10 ENCOUNTER — Encounter: Payer: Self-pay | Admitting: Family Medicine

## 2020-08-13 ENCOUNTER — Ambulatory Visit: Payer: BC Managed Care – PPO | Admitting: Family Medicine

## 2020-08-13 ENCOUNTER — Other Ambulatory Visit: Payer: Self-pay

## 2020-08-13 ENCOUNTER — Encounter: Payer: Self-pay | Admitting: Family Medicine

## 2020-08-13 VITALS — BP 106/80 | HR 78 | Ht 67.0 in | Wt 225.0 lb

## 2020-08-13 DIAGNOSIS — F5101 Primary insomnia: Secondary | ICD-10-CM

## 2020-08-13 DIAGNOSIS — M67479 Ganglion, unspecified ankle and foot: Secondary | ICD-10-CM

## 2020-08-13 MED ORDER — GABAPENTIN 100 MG PO CAPS: 300.0000 mg | ORAL_CAPSULE | Freq: Every evening | ORAL | 2 refills | Status: DC

## 2020-08-13 NOTE — Patient Instructions (Signed)
It was very nice to meet you today. Please enjoy the rest of your week.   Today you were seen for cysts on your feet. They are likely ganglion cyst and do not need to be surgically removed unless they are bothering you.  Consider wearing looser shoes.   I have increased your gabapentin to 300 mg nightly.   Follow up in 2 weeks for follow up visit with Dr. Manson Passey or sooner if needed.   Please call the clinic at [phone] if your symptoms worsen or you have any concerns. It was our pleasure to serve you.

## 2020-08-13 NOTE — Progress Notes (Signed)
    SUBJECTIVE:   CHIEF COMPLAINT / HPI:   [name] is a 24 yo M who presents for the issue below  Cyst on foot Noticed swelling on top of right foot increasing. It is not painful. He wants to make sure it does not need surgery at this time. 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 Endorses that he does tie his shoes tight and feels the "bump" rubbing up against it.   Restless leg syndrome, Insomnia Gabapentin increase at last visit with Dr. Manson Passey to 200 mg was having some relief with this increase but symptoms are returning now.   PERTINENT  PMH / PSH: As above.  OBJECTIVE:   BP 106/80   Pulse 78   Ht 5\' 7"  (1.702 m)   Wt 225 lb (102.1 kg)   SpO2 99%   BMI 35.24 kg/m   General: Appears well, no acute distress. Age appropriate. Cardiac: RRR, normal heart sounds, no murmurs Respiratory: CTAB, normal effort       ASSESSMENT/PLAN:   Ganglion cyst of foot Acute. Bilateral. Painless. Fluctuant mass. Discoloration suggest pressure likely from shoes. Not surgical management at this time.  -Wear loose fitting shoes -Follow up as needed of if increasing in size  Insomnia -Increase gabapentin to 300mg  qhs -Obtain b12 at follow up with Dr. 08/25/20   Tony Friendly, DO [facility]

## 2020-08-18 DIAGNOSIS — M67479 Ganglion, unspecified ankle and foot: Secondary | ICD-10-CM | POA: Insufficient documentation

## 2020-08-18 NOTE — Assessment & Plan Note (Addendum)
-  Increase gabapentin to 300mg  qhs -Obtain b12 at follow up with Dr. 08/25/20

## 2020-08-18 NOTE — Assessment & Plan Note (Addendum)
Acute. Bilateral. Painless. Fluctuant mass. Discoloration suggest pressure likely from shoes. Not surgical management at this time.  -Wear loose fitting shoes -Follow up as needed of if increasing in size

## 2020-08-23 ENCOUNTER — Encounter: Payer: Self-pay | Admitting: Family Medicine

## 2020-08-24 NOTE — Progress Notes (Signed)
° ° °  SUBJECTIVE:   CHIEF COMPLAINT / HPI: Follow-up feet and B12 level  Tony Sharp is a 24 year old gentleman presenting to discuss the following:  B12 deficiency: Level 202 on 07/09/2020.  He was started on B12 1000 mcg daily.  Endorses compliance.  Restless leg syndrome: Chronic for several years, present since high school.  Currently on gabapentin 300 mg nightly, seems to help for the first few days of increased dosage and then does not seem to help much.  He has not tried any other medications for this.  Bilateral cyst on feet: Seem to be getting smaller after not tying shoes as tight.  Not itchy or painful, not particularly bothersome just wanted to make sure everything is okay.  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  Mild ecchymoses present over each place without any surrounding erythema, puslike drainage, or warmth to touch.  Full ROM of ankle joint with normal gait.  See picture in media from office visit on 9/1.  ASSESSMENT/PLAN:   Restless leg syndrome Chronic, poorly controlled.  Ferritin WNL in 05/2020.  History of B12 deficiency may be contributing, will recheck level today after 1 month of supplementation.  Encouraged maintaining hydration and stretching prior to bedtime.  Recommended also trying a glass of tonic water prior to bed with gabapentin as is.  Could consider transition of medication if not improving, such as ropinirole, on follow-up.   Vitamin B12 deficiency Recheck vitamin B12 level today.   Has been on 1000 mcg daily.  Ganglion cyst of foot Subacute, improving with looser fit of shoes.  Reassuringly otherwise not bothersome to the patient.  Recommended continued conservative measures and follow-up if increasing in size, associated pain, weakness, or any numbness/tingling in region.    Follow-up in 1 month with Dr. 06/2020 or sooner if needed.  Manson Passey, DO [facility]

## 2020-08-25 ENCOUNTER — Encounter: Payer: Self-pay | Admitting: Family Medicine

## 2020-08-25 ENCOUNTER — Ambulatory Visit: Payer: BC Managed Care – PPO | Admitting: Family Medicine

## 2020-08-25 ENCOUNTER — Other Ambulatory Visit: Payer: Self-pay

## 2020-08-25 VITALS — BP 118/80 | HR 94 | Ht 67.0 in | Wt 227.6 lb

## 2020-08-25 DIAGNOSIS — M67479 Ganglion, unspecified ankle and foot: Secondary | ICD-10-CM | POA: Diagnosis not present

## 2020-08-25 DIAGNOSIS — E538 Deficiency of other specified B group vitamins: Secondary | ICD-10-CM | POA: Diagnosis not present

## 2020-08-25 DIAGNOSIS — G2581 Restless legs syndrome: Secondary | ICD-10-CM | POA: Diagnosis not present

## 2020-08-25 NOTE — Patient Instructions (Addendum)
It was wonderful meeting you today.   For your restless leg, please make sure that you are drinking plenty water during the day and try to stay physically active.  Try stretching your legs prior to bedtime.  Additionally you could try drinking a glass of tonic water before bed that can be very helpful with any leg cramps or restless leg in addition to your gabapentin.  If this has not made any improvement, you could discuss transition of medication with Dr. Manson Passey.   Keep trying to wear your shoes a little looser as you have been doing.  These will hopefully get better with time, if you start having severe pain associated with them or any new numbness or weakness with your feet please come back right away.  We will recheck your B12 level today.

## 2020-08-25 NOTE — Assessment & Plan Note (Signed)
Chronic, poorly controlled.  Ferritin WNL in 05/2020.  History of B12 deficiency may be contributing, will recheck level today after 1 month of supplementation.  Encouraged maintaining hydration and stretching prior to bedtime.  Recommended also trying a glass of tonic water prior to bed with gabapentin as is.  Could consider transition of medication if not improving, such as ropinirole, on follow-up.

## 2020-08-25 NOTE — Assessment & Plan Note (Signed)
Subacute, improving with looser fit of shoes.  Reassuringly otherwise not bothersome to the patient.  Recommended continued conservative measures and follow-up if increasing in size, associated pain, weakness, or any numbness/tingling in region.

## 2020-08-25 NOTE — Assessment & Plan Note (Signed)
Recheck vitamin B12 level today.  Has been on 1000 mcg daily.

## 2020-08-26 LAB — VITAMIN B12: Vitamin B-12: 553 pg/mL (ref 232–1245)

## 2020-10-20 ENCOUNTER — Telehealth: Payer: Self-pay

## 2020-10-20 NOTE — Telephone Encounter (Signed)
Received fax from pharmacy, PA needed on Testosterone. Clinical questions submitted via Cover My Meds. Waiting on response, could take up to 72 hours.  Cover My Meds info: Key: [id number]

## 2020-10-22 ENCOUNTER — Other Ambulatory Visit: Payer: Self-pay | Admitting: Family Medicine

## 2020-10-22 DIAGNOSIS — F5101 Primary insomnia: Secondary | ICD-10-CM

## 2020-11-08 ENCOUNTER — Encounter: Payer: Self-pay | Admitting: Family Medicine

## 2020-11-08 DIAGNOSIS — F649 Gender identity disorder, unspecified: Secondary | ICD-10-CM

## 2020-11-08 MED ORDER — TESTOSTERONE CYPIONATE 200 MG/ML IM SOLN: INTRAMUSCULAR | 0 refills | Status: DC

## 2020-12-22 ENCOUNTER — Encounter: Payer: Self-pay | Admitting: Family Medicine

## 2020-12-22 ENCOUNTER — Other Ambulatory Visit: Payer: Self-pay | Admitting: *Deleted

## 2020-12-22 DIAGNOSIS — F5101 Primary insomnia: Secondary | ICD-10-CM

## 2020-12-22 MED ORDER — GABAPENTIN 100 MG PO CAPS: 300.0000 mg | ORAL_CAPSULE | Freq: Every evening | ORAL | 2 refills | Status: DC

## 2020-12-22 NOTE — Telephone Encounter (Signed)
Refilled.   Terisa Starr, MD  Family Medicine Teaching Service

## 2021-02-15 ENCOUNTER — Ambulatory Visit: Payer: BC Managed Care – PPO | Admitting: Family Medicine

## 2021-02-15 ENCOUNTER — Other Ambulatory Visit (HOSPITAL_COMMUNITY)
Admission: RE | Admit: 2021-02-15 | Discharge: 2021-02-15 | Disposition: A | Discharge status: Home / Self Care | Payer: BC Managed Care – PPO | Source: Ambulatory Visit | Attending: Family Medicine | Admitting: Family Medicine

## 2021-02-15 ENCOUNTER — Other Ambulatory Visit: Payer: Self-pay

## 2021-02-15 DIAGNOSIS — R300 Dysuria: Secondary | ICD-10-CM | POA: Insufficient documentation

## 2021-02-15 DIAGNOSIS — N3091 Cystitis, unspecified with hematuria: Secondary | ICD-10-CM | POA: Diagnosis not present

## 2021-02-15 DIAGNOSIS — F5101 Primary insomnia: Secondary | ICD-10-CM

## 2021-02-15 DIAGNOSIS — F322 Major depressive disorder, single episode, severe without psychotic features: Secondary | ICD-10-CM

## 2021-02-15 LAB — POCT WET PREP (WET MOUNT)
Clue Cells Wet Prep Whiff POC: NEGATIVE
Trichomonas Wet Prep HPF POC: ABSENT
WBC, Wet Prep HPF POC: >20

## 2021-02-15 MED ORDER — DULOXETINE HCL 60 MG PO CPEP: 60.0000 mg | ORAL_CAPSULE | Freq: Two times a day (BID) | ORAL | 0 refills | Status: AC

## 2021-02-15 MED ORDER — GABAPENTIN 100 MG PO CAPS: 600.0000 mg | ORAL_CAPSULE | Freq: Every day | ORAL | 2 refills | Status: DC

## 2021-02-15 NOTE — Progress Notes (Addendum)
    SUBJECTIVE:   CHIEF COMPLAINT / HPI: having difficulty urinating  Kyi reports that symptoms started Feb 11 and he took an outpatient test that was positive for leukocytes.  He call [facility] and he was prescribed Nitrofurantoin and completed a 5 day course.  He reports that symptoms went away for a couple of days then returned.  He then too another test which was still positive for Leukocytes and he again called [facility] who prescribed Cephalexin for 5 day. He completed this course and reports that symptoms resolved until yesterday. He started Urostat which has not helped.  Reports continues dysuria,feeling sensation to void and hematuria.  Has never had menses secondary to testosterone injection.  Denies any fevers, chills, back pain, vaginal itching or burning.  Positive PHQ9 Denies any active SI/HI today.  Follows with psychiatry.  Mood is good.  PERTINENT  PMH / PSH:   OBJECTIVE:   BP 130/79   Pulse (!) 108   Ht 5\' 7"  (1.702 m)   Wt 242 lb (109.8 kg)   SpO2 97%   BMI 37.90 kg/m    General: Alert, no acute distress Abdomen: Bowel sounds normal. Abdomen soft and non-tender.  Pelvic Exam chaperoned by CMA Shari         External: normal male genitalia without lesions or masses        Vagina: erythema and atrophic vaginal walls        Cervix: normal without lesions or masses          ASSESSMENT/PLAN:   Cystitis with hematuria Failed two courses of antibiotic therapy for UTI.  Reports positive home test for Leukocytes.  Continues to have LUTI symptoms with hematuria.  Given that he has not had menses differentials for hematuria include bladder stones, nephrolithiasis less likely given no back pain or radiation to groin. Also concerning for cervicitis given burning sensation. -PAP, GC&C today -urinalysis and urine culture -renal u/s to ensure no hydronephrosis -If culture positive will treat. -OK to start Amoxicillin for gum graft that was performed and prescribed by different  provider yesterday -Follow up with PCP as needed  MDD (major depressive disorder) Positive PHQ 9.  Follows with psychiatry.  No active SI/HI plan -Continue to follow with psych -Follow up with PCP as needed     , MD [facility]

## 2021-02-15 NOTE — Patient Instructions (Addendum)
Thank you for coming to see me today. It was a pleasure.   Your urine sample sent out for culture.  I will call you with the results and if you need treatment will send a prescription to your pharmacy.  I have placed an order for renal ultrasound.  Please go to [facility] or at [facility] to have this completed.  You do not need an appointment, but if you would like to call them beforehand, their number is [phone].  We will contact you with your results afterwards.  Please follow-up with PCP as follow  If you have any questions or concerns, please do not hesitate to call the office at [phone].  Best,   Dana Allan, MD  Bladder Stone  A bladder stone is a buildup of crystals made from the proteins and minerals found in urine. These substances build up when urine becomes too concentrated. Urine is concentrated when there is less water and more proteins and minerals in it. Bladder stones usually develop when a person has another medical condition that prevents the bladder from emptying completely. Crystals can form in the small amount of urine that is left in the bladder. Bladder stones that grow large can become painful and may block the flow of urine. What are the causes? This condition may be caused by:  An enlarged prostate, which prevents the bladder from emptying well.  An infection of a part of your urinary system (urinary tract infection, or UTI). This includes the: ? Kidneys. ? Bladder. ? Ureters. These are the tubes that carry urine to your bladder. ? Urethra. This is the tube that drains urine from your bladder.  A weak spot in the bladder that creates a small pouch (bladder diverticulum).  Nerve damage that may interfere with the signals from your brain to your bladder muscles (neurogenic bladder). This can result from conditions such as Parkinson's disease or spinal cord injuries. What increases the risk? This condition is  more likely to develop in people who:  Get frequent UTIs.  Have another medical condition that affects the bladder.  Have a history of bladder surgery.  Have a spinal cord injury.  Have an abnormal shape of the bladder (deformity). What are the signs or symptoms? Common symptoms of this condition include:  Pain in the abdomen.  A need to urinate more often.  Difficulty or pain when urinating.  Blood in the urine.  Cloudy urine or urine that is dark in color.  Pain in the penis or testicles in men. Small bladder stones do not always cause symptoms. How is this diagnosed? This condition may be diagnosed based on your symptoms, medical history, and physical exam. The physical exam will check for tenderness in your abdomen. For men, an exam in the rectum may be done to check the prostate gland.  You may have tests, such as: ? A urine test (urinalysis). ? A urine sample test to check for other infections (culture). ? Blood tests, including tests to look for a certain substance (creatinine). A creatinine level that is higher than normal could indicate a blockage. ? A procedure to check your bladder using a scope with a camera (cystoscopy).  You may also have imaging studies, such as: ? CT scan or ultrasound of your abdomen and the area between your hip bones (pelvis or pelvic area). ? An X-ray of your urinary system. How is this treated? This condition may be treated with:  Cystolitholapaxy. This  procedure uses a laser, ultrasound, or other device to break the stone into smaller pieces. Fluids are used to flush the small pieces from the area.  Surgery to remove the stone.  A stent. This is a small mesh tube that is threaded into your ureter to make urine flow.  Medicines to treat pain. Follow these instructions at home: Medicines  Take over-the-counter and prescription medicines only as told by your health care provider.  Ask your health care provider if the medicine  prescribed to you: ? Requires you to avoid driving or using heavy machinery. ? Can cause constipation. You may need to take these actions to prevent or treat constipation:  Take over-the-counter or prescription medicines.  Eat foods that are high in fiber, such as beans, whole grains, and fresh fruits and vegetables.  Limit foods that are high in fat and processed sugars, such as fried or sweet foods. Alcohol use  Do not drink alcohol if: ? Your health care provider tells you not to drink. ? You are pregnant, may be pregnant, or are planning to become pregnant.  If you drink alcohol: ? Limit how much you drink to:  0-1 drink a day for women.  0-2 drinks a day for men. ? Be aware of how much alcohol is in your drink. In the [place] one drink equals one 12 oz bottle of beer (355 mL), one 5 oz glass of wine (148 mL), or one 1 oz glass of hard liquor (44 mL). Activity  Rest as told by your health care provider.  Return to your normal activities as told by your health care provider. Ask your health care provider what activities are safe for you. General instructions  Drink enough fluid to keep your urine pale yellow.  Tell your health care provider about any unusual symptoms related to urinating. Early diagnosis of an enlarged prostate and other bladder conditions may reduce your risk of getting bladder stones.  Do not use any products that contain nicotine or tobacco, such as cigarettes, e-cigarettes, or chewing tobacco. If you need help quitting, ask your health care provider.  Do not use drugs.   Where to find more information [facility]): www.urologyhealth.org Contact a health care provider if you:  Have a fever.  Feel nauseous or vomit.  Are unable to urinate.  Have a large amount of blood in your urine. Get help right away if you:  Have severe back pain or pain in the lower part of your abdomen.  Cannot eat or drink without vomiting.  Vomit after  taking your medicine. Summary  A bladder stone is a buildup of crystals made from the proteins and minerals found in urine. These substances build up when urine becomes too concentrated.  Bladder stones that grow large can become painful and may block the flow of urine.  Bladder stones may be treated with a laser, a stent, surgery, or pain medicines. This information is not intended to replace advice given to you by your health care provider. Make sure you discuss any questions you have with your health care provider. Document Revised: 06/26/2019 Document Reviewed: 06/26/2019 Elsevier Patient Education  2021 [employer].

## 2021-02-16 ENCOUNTER — Encounter: Payer: Self-pay | Admitting: Family Medicine

## 2021-02-16 DIAGNOSIS — N3091 Cystitis, unspecified with hematuria: Secondary | ICD-10-CM | POA: Insufficient documentation

## 2021-02-16 DIAGNOSIS — N3090 Cystitis, unspecified without hematuria: Secondary | ICD-10-CM | POA: Insufficient documentation

## 2021-02-16 DIAGNOSIS — F329 Major depressive disorder, single episode, unspecified: Secondary | ICD-10-CM | POA: Insufficient documentation

## 2021-02-16 LAB — CBC
Hematocrit: 44.8 % (ref 37.5–51.0)
Hemoglobin: 15.3 g/dL (ref 13.0–17.7)
MCH: 30.4 pg (ref 26.6–33.0)
MCHC: 34.2 g/dL (ref 31.5–35.7)
MCV: 89 fL (ref 79–97)
Platelets: 358 10*3/uL (ref 150–450)
RBC: 5.03 x10E6/uL (ref 4.14–5.80)
RDW: 12.4 % (ref 11.6–15.4)
WBC: 5.6 10*3/uL (ref 3.4–10.8)

## 2021-02-16 LAB — BASIC METABOLIC PANEL
BUN/Creatinine Ratio: 10 (ref 9–20)
BUN: 9 mg/dL (ref 6–20)
CO2: 24 mmol/L (ref 20–29)
Calcium: 9.5 mg/dL (ref 8.7–10.2)
Chloride: 104 mmol/L (ref 96–106)
Creatinine, Ser: 0.94 mg/dL (ref 0.76–1.27)
Glucose: 84 mg/dL (ref 65–99)
Potassium: 4.7 mmol/L (ref 3.5–5.2)
Sodium: 142 mmol/L (ref 134–144)
eGFR: 117 mL/min/{1.73_m2} (ref >59)

## 2021-02-16 LAB — CERVICOVAGINAL ANCILLARY ONLY
Chlamydia: NEGATIVE
Comment: NEGATIVE
Comment: NORMAL
Neisseria Gonorrhea: NEGATIVE

## 2021-02-16 LAB — HIV ANTIBODY (ROUTINE TESTING W REFLEX): HIV Screen 4th Generation wRfx: NONREACTIVE

## 2021-02-16 LAB — CYTOLOGY - PAP: Diagnosis: NEGATIVE

## 2021-02-16 NOTE — Assessment & Plan Note (Signed)
Failed two courses of antibiotic therapy for UTI.  Reports positive home test for Leukocytes.  Continues to have LUTI symptoms with hematuria.  Given that he has not had menses differentials for hematuria include bladder stones, nephrolithiasis less likely given no back pain or radiation to groin. Also concerning for cervicitis given burning sensation. -PAP, GC&C today -urinalysis and urine culture -renal u/s to ensure no hydronephrosis -If culture positive will treat. -OK to start Amoxicillin for gum graft that was performed and prescribed by different provider yesterday -Follow up with PCP as needed

## 2021-02-16 NOTE — Assessment & Plan Note (Signed)
Positive PHQ 9.  Follows with psychiatry.  No active SI/HI plan -Continue to follow with psych -Follow up with PCP as needed

## 2021-02-17 ENCOUNTER — Other Ambulatory Visit: Payer: Self-pay | Admitting: Family Medicine

## 2021-02-17 LAB — URINE CULTURE

## 2021-02-17 MED ORDER — SULFAMETHOXAZOLE-TRIMETHOPRIM 800-160 MG PO TABS: 1.0000 | ORAL_TABLET | Freq: Two times a day (BID) | ORAL | 0 refills | Status: AC

## 2021-02-17 NOTE — Progress Notes (Signed)
Urine culture positive for E.Coli.  Previously failed treatment with 5 days of Keflex and Nitrofurantoin.  Sent script for Bactrim DS BID x 7/7.  Will need POCT 1 week after treatment.  Called patient x 2 no answer.  LVM for patient to return call.  Please let patient know of the above.    Dana Allan, MD Family Medicine Residency

## 2021-02-28 ENCOUNTER — Encounter: Payer: Self-pay | Admitting: Family Medicine

## 2021-02-28 ENCOUNTER — Ambulatory Visit
Admission: RE | Admit: 2021-02-28 | Discharge: 2021-02-28 | Disposition: A | Discharge status: Home / Self Care | Payer: BC Managed Care – PPO | Source: Ambulatory Visit | Attending: Family Medicine | Admitting: Family Medicine

## 2021-02-28 ENCOUNTER — Other Ambulatory Visit: Payer: Self-pay

## 2021-02-28 ENCOUNTER — Ambulatory Visit: Payer: BC Managed Care – PPO | Admitting: Family Medicine

## 2021-02-28 VITALS — BP 129/88 | HR 82 | Wt 238.0 lb

## 2021-02-28 DIAGNOSIS — R300 Dysuria: Secondary | ICD-10-CM

## 2021-02-28 DIAGNOSIS — N3090 Cystitis, unspecified without hematuria: Secondary | ICD-10-CM

## 2021-02-28 DIAGNOSIS — F649 Gender identity disorder, unspecified: Secondary | ICD-10-CM | POA: Diagnosis not present

## 2021-02-28 DIAGNOSIS — Z789 Other specified health status: Secondary | ICD-10-CM

## 2021-02-28 DIAGNOSIS — E538 Deficiency of other specified B group vitamins: Secondary | ICD-10-CM

## 2021-02-28 LAB — POCT UA - MICROSCOPIC ONLY

## 2021-02-28 LAB — POCT URINALYSIS DIP (MANUAL ENTRY)
Bilirubin, UA: NEGATIVE
Blood, UA: NEGATIVE
Glucose, UA: NEGATIVE mg/dL
Ketones, POC UA: NEGATIVE mg/dL
Nitrite, UA: NEGATIVE
Protein Ur, POC: NEGATIVE mg/dL
Spec Grav, UA: 1.02 (ref 1.010–1.025)
Urobilinogen, UA: 0.2 E.U./dL
pH, UA: 8.5 — AB (ref 5.0–8.0)

## 2021-02-28 MED ORDER — TESTOSTERONE CYPIONATE 200 MG/ML IM SOLN: INTRAMUSCULAR | 0 refills | Status: DC

## 2021-02-28 NOTE — Assessment & Plan Note (Signed)
Repeat fall 2021

## 2021-02-28 NOTE — Patient Instructions (Addendum)
It was wonderful to see you today.  Please bring ALL of your medications with you to every visit.   Today we talked about:   --Checking your blood work  --Merchandiser, retail urine for culture --I will call you about an antibiotic   --When you have constipation---please try Miralax 1 capful mixed in 8 ounces of water   --I will call you about the ultrasound    Thank you for choosing [facility].   Please call [phone] with any questions about today's appointment.  Please be sure to schedule follow up at the front  desk before you leave today.   Terisa Starr, MD  Family Medicine

## 2021-02-28 NOTE — Assessment & Plan Note (Signed)
Proper wiping behavior, no new soaps or cleansers.  He does have some constipation at times which may contribute.  Nocturia is also recurred which is slightly concerning.  Will obtain routine labs today including A1c.  Also consider urethral stricture or other anatomic cause given his history of nocturia.  Would also have concentrating defect in loop of henle/collecting duct? Urine culture, he will call if symptoms worsen in next 24 hours. Discussed. Culture today.

## 2021-02-28 NOTE — Assessment & Plan Note (Signed)
Routine labs, PMP reviewed, appropriate. Reviewed side effects. Rx refilled.

## 2021-02-28 NOTE — Progress Notes (Signed)
    SUBJECTIVE:   CHIEF COMPLAINT / HPI:   Tony Sharp is a pleasant 25 yo FTM presenting today for routine follow-up he has several concerns.  Over the past 2 months since has had 3 urinary tract infections.  One was culture proven.  He thinks he has another today.  He endorses some urinary frequency which is how he reports they start.  He denies hematuria at this time, has significant burning with urination fever, flank pain.  He has a history of urinary incontinence at night which is recurred over the last month.  He has been drinking increased amount of alcohol.  He took a home urinary tract infection test which was positive for leukocytes negative for nitrates.  First 2 infections were treated without culture through telemedicine with cephalexin and nitrofurantoin.  Follow-up culture showed pansensitive E. coli which was treated appropriately with Bactrim.  Patient reports compliance with his testosterone.  He is due for refill today. Denies adverse effects. Had top surgery 3 months ago by Dr. Learta Codding in [place]. Healing well and pleased.  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 Has had change in psychiatrist.   PERTINENT  PMH / PSH/Family/Social History : reviewed and updated   OBJECTIVE:   BP 129/88   Pulse 82   Wt 238 lb (108 kg)   BMI 37.28 kg/m   Today's weight:  Last Weight  Most recent update: 02/28/2021 11:42 AM   Weight  108 kg (238 lb)           Review of prior weights: Filed Weights   02/28/21 1140  Weight: 238 lb (108 kg)     Cardiac: Regular rate and rhythm. Normal S1/S2. No murmurs, rubs, or gallops appreciated. Lungs: Clear bilaterally to ascultation.  Psych: Pleasant and appropriate    ASSESSMENT/PLAN:   Male-to-male transgender person Routine labs, PMP reviewed, appropriate. Reviewed side effects. Rx refilled.   Recurrent cystitis Proper wiping behavior, no new soaps or cleansers.  He  does have some constipation at times which may contribute.  Nocturia is also recurred which is slightly concerning.  Will obtain routine labs today including A1c.  Also consider urethral stricture or other anatomic cause given his history of nocturia.  Would also have concentrating defect in loop of henle/collecting duct? Urine culture, he will call if symptoms worsen in next 24 hours. Discussed. Culture today.   Vitamin B12 deficiency Repeat fall 2021    HCM At follow up discuss weight gain  COVID vaccine not available today Discus HPV at follow up      Terisa Starr, MD  Family Medicine Teaching Service  [facility]

## 2021-03-01 ENCOUNTER — Encounter: Payer: Self-pay | Admitting: Family Medicine

## 2021-03-01 LAB — COMPREHENSIVE METABOLIC PANEL
ALT: 31 IU/L (ref 0–44)
AST: 25 IU/L (ref 0–40)
Albumin/Globulin Ratio: 1.7 (ref 1.2–2.2)
Albumin: 4.7 g/dL (ref 4.1–5.2)
Alkaline Phosphatase: 125 IU/L — ABNORMAL HIGH (ref 44–121)
BUN/Creatinine Ratio: 7 — ABNORMAL LOW (ref 9–20)
BUN: 6 mg/dL (ref 6–20)
Bilirubin Total: 0.3 mg/dL (ref 0.0–1.2)
CO2: 22 mmol/L (ref 20–29)
Calcium: 9.4 mg/dL (ref 8.7–10.2)
Chloride: 100 mmol/L (ref 96–106)
Creatinine, Ser: 0.87 mg/dL (ref 0.76–1.27)
Globulin, Total: 2.8 g/dL (ref 1.5–4.5)
Glucose: 79 mg/dL (ref 65–99)
Potassium: 4.6 mmol/L (ref 3.5–5.2)
Sodium: 137 mmol/L (ref 134–144)
Total Protein: 7.5 g/dL (ref 6.0–8.5)
eGFR: 124 mL/min/{1.73_m2} (ref >59)

## 2021-03-01 LAB — CBC
Hematocrit: 46.2 % (ref 37.5–51.0)
Hemoglobin: 15.4 g/dL (ref 13.0–17.7)
MCH: 30.1 pg (ref 26.6–33.0)
MCHC: 33.3 g/dL (ref 31.5–35.7)
MCV: 90 fL (ref 79–97)
Platelets: 427 10*3/uL (ref 150–450)
RBC: 5.12 x10E6/uL (ref 4.14–5.80)
RDW: 12.1 % (ref 11.6–15.4)
WBC: 5.1 10*3/uL (ref 3.4–10.8)

## 2021-03-01 LAB — HEMOGLOBIN A1C
Est. average glucose Bld gHb Est-mCnc: 100 mg/dL
Hgb A1c MFr Bld: 5.1 % (ref 4.8–5.6)

## 2021-03-01 LAB — TESTOSTERONE: Testosterone: 562 ng/dL (ref 264–916)

## 2021-03-02 ENCOUNTER — Telehealth: Payer: Self-pay | Admitting: Family Medicine

## 2021-03-02 LAB — URINE CULTURE

## 2021-03-02 NOTE — Telephone Encounter (Signed)
Attempted to call left generic voicemail.  If calls back - Urine culture negative - All other labs normal - Kidney ultrasound normal  Will send via mychart. I would like to discuss urinary incontinence with him if he calls back.  Terisa Starr, MD  Family Medicine Teaching Service

## 2021-03-14 ENCOUNTER — Encounter: Payer: Self-pay | Admitting: Family Medicine

## 2021-03-14 DIAGNOSIS — N3090 Cystitis, unspecified without hematuria: Secondary | ICD-10-CM

## 2021-03-14 DIAGNOSIS — R32 Unspecified urinary incontinence: Secondary | ICD-10-CM

## 2021-06-06 ENCOUNTER — Ambulatory Visit: Payer: BC Managed Care – PPO | Admitting: Family Medicine

## 2021-06-06 ENCOUNTER — Encounter: Payer: Self-pay | Admitting: Family Medicine

## 2021-06-06 ENCOUNTER — Other Ambulatory Visit: Payer: Self-pay

## 2021-06-06 VITALS — BP 121/72 | HR 72 | Wt 237.6 lb

## 2021-06-06 DIAGNOSIS — Z789 Other specified health status: Secondary | ICD-10-CM | POA: Diagnosis not present

## 2021-06-06 DIAGNOSIS — E538 Deficiency of other specified B group vitamins: Secondary | ICD-10-CM | POA: Diagnosis not present

## 2021-06-06 NOTE — Progress Notes (Signed)
    SUBJECTIVE:   CHIEF COMPLAINT: forms and check in HPI:   Tony Sharp is a 25 y.o. yo with history notable for mood disorder, testosterone use for transgender status, and obesity presenting for check in.  He is starting school at [employer] school of the [employer] for [employer] in fall. He has paperwork to completed.   He is interested in cutting back on alcohol. He started using alcohol 2 months ago and last month has had more of a problem. Contemplating (6.5/10) about going to AA tonight. Regularly sees therapist.  Has psychiatry next week. Reports mood is okay (just returned from [place] on trip). Endorses passive SI, has many support systems. No plans or change in pervasiveness of thoughts.   Reports he missed a dose of testosterone over the weekend. He is doing well with this. Insurance medication coverage ([employer]) does not cover this medication.   PERTINENT  PMH / PSH/Family/Social History : reviewed, needs monitoring labs in falls   OBJECTIVE:   BP 121/72   Pulse 72   Wt 237 lb 9.6 oz (107.8 kg)   SpO2 97%   BMI 37.21 kg/m   Today's weight:  Last Weight  Most recent update: 06/06/2021  9:55 AM    Weight  107.8 kg (237 lb 9.6 oz)            Review of prior weights: [employer]   06/06/21 0954  Weight: 237 lb 9.6 oz (107.8 kg)    Cardiac: Regular rate and rhythm. Normal S1/S2. No murmurs, rubs, or gallops appreciated. Lungs: Clear bilaterally to ascultation.  Abdomen: Normoactive bowel sounds. No tenderness to deep or light palpation. No rebound or guarding. No TTP in RUQ or hepatomegaly.  Psych: Pleasant and appropriate    ASSESSMENT/PLAN:   Male-to-male transgender person No side effects Missed dose yesterday, will wait to check labs Monitoring labs in 3 months   Vitamin B12 deficiency Repeat at follow up   EtOH Use discussed resources, recommended attending AA. Discussed restarting gabapentin. Provided education on long term effects.  Mood Disorder,  medication list updated, resource list given in case symptoms worsen.   HCM  Requested vaccine records to update HCM and form  Needs a PPD later this week (not done today due to work schedule)--no nursing visits available when patient needed PPD (can have nurse visit follow up to read PPD)  May also need vaccines at visit on Wednesday     Terisa Starr, MD  Family Medicine Teaching Service  [facility]

## 2021-06-06 NOTE — Assessment & Plan Note (Signed)
No side effects Missed dose yesterday, will wait to check labs Monitoring labs in 3 months

## 2021-06-06 NOTE — Patient Instructions (Signed)
Thank you for coming to see me today. It was a pleasure.   Return on Friday, June 24 to have reading at 9 am  Please follow-up with PCP as needed  If you have any questions or concerns, please do not hesitate to call the office at [phone].  Best,   Dana Allan, MD

## 2021-06-06 NOTE — Progress Notes (Signed)
    SUBJECTIVE:   CHIEF COMPLAINT / HPI: here for PPD test  No acute concerns.  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    PERTINENT  PMH / PSH:  None  OBJECTIVE:   BP 102/60   Pulse 78   Ht 5\' 7"  (1.702 m)   Wt 237 lb 8 oz (107.7 kg)   SpO2 98%   BMI 37.20 kg/m    General: Alert, no acute distress  ASSESSMENT/PLAN:   Encounter for PPD test PPD test today Patient scheduled to return on Friday at 9 am in RN clinic to have read. Follow up with PCP as needed     Tuesday, MD [facility]

## 2021-06-06 NOTE — Patient Instructions (Addendum)
It was wonderful to see you today.  Please bring ALL of your medications with you to every visit.   Today we talked about:  --Cutting down on alcohol   --We will check blood work at your next visit   --Return on Wednesday for your PPD  -- I will review your vaccine records and give you a call    Thank you for choosing [facility].   Please call [phone] with any questions about today's appointment.  Please be sure to schedule follow up at the front  desk before you leave today.   Terisa Starr, MD  Family Medicine     If you are feeling suicidal or depression symptoms worsen please immediately go to:   24 Hour Availability [facility]  [address] [place], [place] [phone] Crisis [phone]    If you are thinking about harming yourself or having thoughts of suicide, or if you know someone who is, seek help right away. Call your doctor or mental health care provider. Call 911 or go to a hospital emergency room to get immediate help, or ask a friend or family member to help you do these things. Call the [place] National Suicide Prevention Lifeline's toll-free, 24-hour hotline at 1-800-273-TALK (229) 460-4287) or TTY: 1-800-799-4 TTY 502-547-0105) to talk to a trained counselor. If you are in crisis, make sure you are not left alone.  If someone else is in crisis, make sure he or she is not left alone   Family Service of the [employer] (Domestic Violence, Rape & Victim Assistance [phone]  [facility] Crisis [facility]    (ONLY from 8am-4pm)    [phone]  [facility] Mobile Crisis Unit (24/7)   [phone]  [place] National Suicide Hotline   9195899002 Len Childs)

## 2021-06-06 NOTE — Assessment & Plan Note (Signed)
Repeat at follow up

## 2021-06-08 ENCOUNTER — Ambulatory Visit: Payer: BC Managed Care – PPO | Admitting: Family Medicine

## 2021-06-08 ENCOUNTER — Other Ambulatory Visit: Payer: Self-pay

## 2021-06-08 DIAGNOSIS — Z111 Encounter for screening for respiratory tuberculosis: Secondary | ICD-10-CM

## 2021-06-08 NOTE — Progress Notes (Signed)
Skin PPD was placed today 1st attempt was not successful in the Left forearm. I had Jessica the team lead do the 2nd attempt on Right forearm that was successful and had a wheal. Patient was scheduled for nurse visit for Friday June 24th at 9:00 to have TB read. Marian Grandt, CMA    

## 2021-06-10 ENCOUNTER — Telehealth: Payer: Self-pay | Admitting: Family Medicine

## 2021-06-10 ENCOUNTER — Encounter: Payer: Self-pay | Admitting: Family Medicine

## 2021-06-10 ENCOUNTER — Ambulatory Visit (INDEPENDENT_AMBULATORY_CARE_PROVIDER_SITE_OTHER): Payer: BC Managed Care – PPO

## 2021-06-10 ENCOUNTER — Other Ambulatory Visit: Payer: Self-pay

## 2021-06-10 DIAGNOSIS — Z111 Encounter for screening for respiratory tuberculosis: Secondary | ICD-10-CM | POA: Insufficient documentation

## 2021-06-10 NOTE — Progress Notes (Signed)
Patient is here for a PPD read.  It was placed on 06/08/2021 in the right forearm @ 0850 am.    PPD RESULTS:  Result: negative Induration: 0 mm  Letter created and given to patient for documentation purposes. Veronda Prude, RN

## 2021-06-10 NOTE — Telephone Encounter (Signed)
Called patient about paperwork. He received appropriate vaccines, confirmed address of childhood home.  He is due for a Tdap but had one in July 2021 at [facility]--nursing- can we work to get these records?  Let me know if issues.  Terisa Starr, MD  Family Medicine Teaching Service

## 2021-06-10 NOTE — Assessment & Plan Note (Signed)
PPD test today Patient scheduled to return on Friday at 9 am in RN clinic to have read. Follow up with PCP as needed

## 2021-06-13 NOTE — Telephone Encounter (Signed)
Request for Immunization record sent to [facility].  Glennie Hawk, CMA

## 2021-06-15 LAB — TB SKIN TEST
Induration: 0 mm
TB Skin Test: NEGATIVE

## 2021-06-15 NOTE — Addendum Note (Signed)
Addended by: Glori Bickers C on: 06/15/2021 11:05 AM   Modules accepted: Orders

## 2021-06-17 NOTE — Telephone Encounter (Signed)
Form completed. Handed to patient. Copy made, will scan into record.  Terisa Starr, MD  Family Medicine Teaching Service

## 2021-06-17 NOTE — Telephone Encounter (Signed)
Spoke to patient and learned the urgency of getting Immunization Records from [facility].   Explained to patient that a request was faxed to [facility] and that we have not received them as yet.  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  Patient will go over to pick up, then come here to [facility] to get remainder of paperwork from Dr. Manson Passey.  Glennie Hawk, CMA

## 2021-06-17 NOTE — Telephone Encounter (Signed)
Patient is calling to check on the status of receiving the shot records. He would like to pick them up at the front desk when available.   Please call to let him know when we have received them and they are able to be picked up. The best call back is [phone]

## 2021-06-27 ENCOUNTER — Telehealth: Payer: BC Managed Care – PPO | Admitting: Family

## 2021-06-27 DIAGNOSIS — R399 Unspecified symptoms and signs involving the genitourinary system: Secondary | ICD-10-CM

## 2021-06-27 MED ORDER — CEPHALEXIN 500 MG PO CAPS: 500.0000 mg | ORAL_CAPSULE | Freq: Two times a day (BID) | ORAL | 0 refills | Status: DC

## 2021-06-27 NOTE — Progress Notes (Signed)
Virtual Visit Consent   Tony Sharp, you are scheduled for a virtual visit with a  provider today.     Just as with appointments in the office, your consent must be obtained to participate.  Your consent will be active for this visit and any virtual visit you may have with one of our providers in the next 365 days.     If you have a MyChart account, a copy of this consent can be sent to you electronically.  All virtual visits are billed to your insurance company just like a traditional visit in the office.    As this is a virtual visit, video technology does not allow for your provider to perform a traditional examination.  This may limit your provider's ability to fully assess your condition.  If your provider identifies any concerns that need to be evaluated in person or the need to arrange testing (such as labs, EKG, etc.), we will make arrangements to do so.     Although advances in technology are sophisticated, we cannot ensure that it will always work on either your end or our end.  If the connection with a video visit is poor, the visit may have to be switched to a telephone visit.  With either a video or telephone visit, we are not always able to ensure that we have a secure connection.     I need to obtain your verbal consent now.   Are you willing to proceed with your visit today?    LORCAN SHELP has provided verbal consent on 06/27/2021 for a virtual visit (video or telephone).   Jannifer Rodney, FNP   Date: 06/27/2021 7:08 PM   Virtual Visit via Video Note   I, Jannifer Rodney, connected with  Tony Sharp  ([id number], 16-Jul-1996) on 06/27/21 at  7:00 PM EDT by a video-enabled telemedicine application and verified that I am speaking with the correct person using two identifiers.  Location: Patient: Virtual Visit Location Patient: Home Provider: Virtual Visit Location Provider: Home   I discussed the limitations of evaluation and management by telemedicine and the  availability of in person appointments. The patient expressed understanding and agreed to proceed.    History of Present Illness: Tony Sharp is a 25 y.o. who identifies as a transgender male / male-to-male who was assigned adult at birth, and is being seen today for UTI symptoms.  HPI: Dysuria  This is a new problem. The current episode started today. The problem has been unchanged. The quality of the pain is described as burning. The pain is at a severity of 6/10. The pain is mild. Associated symptoms include frequency, hesitancy and urgency. Pertinent negatives include no discharge, flank pain, hematuria, nausea or vomiting. He has tried increased fluids for the symptoms. The treatment provided mild relief.   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   Male-to-male transgender person 05/24/2018    Allergies: No Known Allergies Medications:  Current Outpatient Medications:    cephALEXin (KEFLEX) 500 MG capsule, Take 1 capsule (500 mg total) by mouth 2 (two) times daily., Disp: 14 capsule, Rfl: 0   amphetamine-dextroamphetamine (ADDERALL XR) 10 MG 24 hr capsule, Take 15 mg by mouth daily., Disp: , Rfl:  busPIRone (BUSPAR) 30 MG tablet, Take 30 mg by mouth 2 (two) times daily., Disp: , Rfl:    Cyanocobalamin 1000 MCG CAPS, Take 1 tablet by mouth daily, Disp: 90 capsule, Rfl: 3   DULoxetine (CYMBALTA) 60 MG capsule, Take 1 capsule (60 mg total) by mouth 2 (two) times daily., Disp: 90 capsule, Rfl: 0   NEEDLE, DISP, 18 G (B-D BLUNT FILL NEEDLE) 18G X 1-1/2" MISC, Use as directed with testosterone, Disp: 50 each, Rfl: 1   NEEDLE, DISP, 25 G (BD SAFETYGLIDE NEEDLE) 25G X 5/8" MISC, Use as directed, Disp: 100 each, Rfl: 3   Syringe, Disposable,  (B-D SYRINGE LUER-LOK 1CC) 1 ML MISC, Use as directed with testosterone injection, Disp: 50 each, Rfl: 2   testosterone cypionate (DEPOTESTOSTERONE CYPIONATE) 200 MG/ML injection, Inject 60 mg subcutaneously each week, Disp: 10 mL, Rfl: 0  Observations/Objective: Patient is well-developed, well-nourished in no acute distress.  Resting comfortably at home.  Head is normocephalic, atraumatic.  No labored breathing. Speech is clear and coherent with logical content.  Patient is alert and oriented at baseline.   Assessment and Plan: 1. UTI symptoms - cephALEXin (KEFLEX) 500 MG capsule; Take 1 capsule (500 mg total) by mouth 2 (two) times daily.  Dispense: 14 capsule; Refill: 0 Force fluids AZO over the counter X2 days RTO if symptoms worsen or do not improve  Follow Up Instructions: I discussed the assessment and treatment plan with the patient. The patient was provided an opportunity to ask questions and all were answered. The patient agreed with the plan and demonstrated an understanding of the instructions.  A copy of instructions were sent to the patient via MyChart.  The patient was advised to call back or seek an in-person evaluation if the symptoms worsen or if the condition fails to improve as anticipated.  Time:  I spent 7 minutes with the patient via telehealth technology discussing the above problems/concerns.    Jannifer Rodney, FNP

## 2021-11-22 ENCOUNTER — Encounter: Payer: Self-pay | Admitting: Family Medicine

## 2021-12-28 ENCOUNTER — Telehealth: Payer: BC Managed Care – PPO | Admitting: Physician Assistant

## 2021-12-28 DIAGNOSIS — R3989 Other symptoms and signs involving the genitourinary system: Secondary | ICD-10-CM | POA: Diagnosis not present

## 2021-12-28 MED ORDER — CEPHALEXIN 500 MG PO CAPS: 500.0000 mg | ORAL_CAPSULE | Freq: Two times a day (BID) | ORAL | 0 refills | Status: AC

## 2021-12-28 NOTE — Progress Notes (Signed)

## 2021-12-28 NOTE — Progress Notes (Signed)
I have spent 5 minutes in review of e-visit questionnaire, review and updating patient chart, medical decision making and response to patient.   Kaspar Albornoz Cody Briseida Gittings, PA-C    

## 2022-01-02 ENCOUNTER — Ambulatory Visit: Payer: BC Managed Care – PPO | Admitting: Family Medicine

## 2022-01-11 ENCOUNTER — Ambulatory Visit: Payer: BC Managed Care – PPO | Admitting: Family Medicine

## 2022-01-11 ENCOUNTER — Encounter: Payer: Self-pay | Admitting: Family Medicine

## 2022-01-11 ENCOUNTER — Other Ambulatory Visit: Payer: Self-pay

## 2022-01-11 VITALS — BP 125/70 | HR 95 | Ht 67.0 in | Wt 246.6 lb

## 2022-01-11 DIAGNOSIS — Z789 Other specified health status: Secondary | ICD-10-CM

## 2022-01-11 DIAGNOSIS — M25562 Pain in left knee: Secondary | ICD-10-CM

## 2022-01-11 DIAGNOSIS — N3090 Cystitis, unspecified without hematuria: Secondary | ICD-10-CM

## 2022-01-11 NOTE — Patient Instructions (Addendum)
It was wonderful to see you today.  Please bring ALL of your medications with you to every visit.   Today we talked about:  - I will send in your refills once you blood work resturns  - [facility]- I recommend Dr. Arita Miss  [phone]    --Going to the lab--I will call you with results  An x-ray was ordered for you---you do not need an appointment to have this completed.  I recommend going to [facility] [address] [place] [address] [place]   If the results are normal,I will send you a letter  I will call you with results if anything is abnormal   Congratulations on quitting alcohol  I have referred you to [facility]  to further evaluate your concern. If you do not received a phone call about this appointment within 2 weeks, please call our office back at [phone]. Ree Shay coordinates our referrals and can assist you in this.   Thank you for choosing [facility].   Please call [phone] with any questions about today's appointment.  Please be sure to schedule follow up at the front  desk before you leave today.   Terisa Starr, MD  Family Medicine

## 2022-01-11 NOTE — Progress Notes (Signed)
° ° °  SUBJECTIVE:   CHIEF COMPLAINT: knee pain  HPI:   Tony Sharp is a 26 y.o.  with history notable for frequent UTI presenting for follow up.    Knee Pain  Reports ongoing left knee pain, on and off X1 year. Has had recurring issue since teen years. Has tried no OTC medications, variable improvement with stretches. Notices it when going from squatting to standing and sometimes at night after a long day. He is full time student now still working on weekends. No prior injury or trauma. No redness, edema, other joint symptoms. Does endorses catching and sensation of locking  Urinary Symptoms Reports resolution of symptoms. Believes this is due to masturbation. Is interested in seeing Urology.   Alcohol use Has been without for ~5 months. Minimal cravings.   Mood changes Reports mood is okay. School is much better as he feels like really working toward progress. Endorses thoughts of not wanting to live ('pathway in brain') no plans or intent. Has regular therapy.   Testosterone  Current dose is on 60 mg. Last dose Sunday. Reports no adverse side effects.   PERTINENT  PMH / PSH/Family/Social History : reviewed   OBJECTIVE:   BP 125/70    Pulse 95    Ht 5\' 7"  (1.702 m)    Wt 246 lb 9.6 oz (111.9 kg)    SpO2 97%    BMI 38.62 kg/m   Today's weight:  Last Weight  Most recent update: 01/11/2022  8:42 AM    Weight  111.9 kg (246 lb 9.6 oz)            Review of prior weights: 01/13/2022   01/11/22 0836  Weight: 246 lb 9.6 oz (111.9 kg)     Cardiac: Regular rate and rhythm. Normal S1/S2. No murmurs, rubs, or gallops appreciated. Lungs: Clear bilaterally to ascultation.  Psych: Pleasant and appropriate  KNEE  Is ligamentously intact to:        Varus stress        Valgus stress        Anterior drawer / Lachman testing + Thelassy on L  There is NO effusion  ASSESSMENT/PLAN:   Male-to-male transgender person Monitoring labs and lipids today.   Recurrent  cystitis Number given to [facility].    Left knee pain suspect meniscal pathology, will start with knee x-ray, recommended continuing HEP, he is willing to small dose of ibuprofen. Given exam findings, will refer to Sports Medicine for possible ultrasound. Less likely ACL injury, OA, OCD lesion but will obtain x-ray to evaluate for bony pathology. Weight possible contributor.   History of alcohol use, briefly discussed naltrexone.   Mood disorder, following with outside providers, congratulated on school accomplishments, monitor, PHQ noted.   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

## 2022-01-11 NOTE — Assessment & Plan Note (Signed)
Number given to [facility].

## 2022-01-11 NOTE — Assessment & Plan Note (Signed)
Monitoring labs and lipids today.

## 2022-01-12 ENCOUNTER — Ambulatory Visit
Admission: RE | Admit: 2022-01-12 | Discharge: 2022-01-12 | Disposition: A | Discharge status: Home / Self Care | Payer: BC Managed Care – PPO | Source: Ambulatory Visit | Attending: Family Medicine | Admitting: Family Medicine

## 2022-01-12 ENCOUNTER — Telehealth: Payer: Self-pay | Admitting: Family Medicine

## 2022-01-12 DIAGNOSIS — F649 Gender identity disorder, unspecified: Secondary | ICD-10-CM

## 2022-01-12 DIAGNOSIS — M25562 Pain in left knee: Secondary | ICD-10-CM

## 2022-01-12 LAB — LIPID PANEL
Chol/HDL Ratio: 5.6 ratio — ABNORMAL HIGH (ref 0.0–5.0)
Cholesterol, Total: 158 mg/dL (ref 100–199)
HDL: 28 mg/dL — ABNORMAL LOW (ref >39)
LDL Chol Calc (NIH): 105 mg/dL — ABNORMAL HIGH (ref 0–99)
Triglycerides: 136 mg/dL (ref 0–149)
VLDL Cholesterol Cal: 25 mg/dL (ref 5–40)

## 2022-01-12 LAB — CBC
Hematocrit: 44.4 % (ref 37.5–51.0)
Hemoglobin: 15.1 g/dL (ref 13.0–17.7)
MCH: 30 pg (ref 26.6–33.0)
MCHC: 34 g/dL (ref 31.5–35.7)
MCV: 88 fL (ref 79–97)
Platelets: 343 10*3/uL (ref 150–450)
RBC: 5.03 x10E6/uL (ref 4.14–5.80)
RDW: 12.7 % (ref 11.6–15.4)
WBC: 5.6 10*3/uL (ref 3.4–10.8)

## 2022-01-12 LAB — COMPREHENSIVE METABOLIC PANEL
ALT: 20 IU/L (ref 0–44)
AST: 15 IU/L (ref 0–40)
Albumin/Globulin Ratio: 1.9 (ref 1.2–2.2)
Albumin: 4.6 g/dL (ref 4.1–5.2)
Alkaline Phosphatase: 116 IU/L (ref 44–121)
BUN/Creatinine Ratio: 12 (ref 9–20)
BUN: 11 mg/dL (ref 6–20)
Bilirubin Total: 0.4 mg/dL (ref 0.0–1.2)
CO2: 24 mmol/L (ref 20–29)
Calcium: 9.4 mg/dL (ref 8.7–10.2)
Chloride: 101 mmol/L (ref 96–106)
Creatinine, Ser: 0.94 mg/dL (ref 0.76–1.27)
Globulin, Total: 2.4 g/dL (ref 1.5–4.5)
Glucose: 95 mg/dL (ref 70–99)
Potassium: 4 mmol/L (ref 3.5–5.2)
Sodium: 138 mmol/L (ref 134–144)
Total Protein: 7 g/dL (ref 6.0–8.5)
eGFR: 116 mL/min/{1.73_m2} (ref >59)

## 2022-01-12 LAB — TESTOSTERONE: Testosterone: 277 ng/dL (ref 264–916)

## 2022-01-12 MED ORDER — TESTOSTERONE CYPIONATE 200 MG/ML IM SOLN: INTRAMUSCULAR | 0 refills | Status: DC

## 2022-01-12 NOTE — Telephone Encounter (Signed)
Labs appropriate, PDMP reviewed, refill sent of Testosterone.  Dorris Singh, MD  Family Medicine Teaching Service

## 2022-01-13 ENCOUNTER — Telehealth: Payer: Self-pay

## 2022-01-13 ENCOUNTER — Other Ambulatory Visit (HOSPITAL_COMMUNITY): Payer: Self-pay

## 2022-01-13 NOTE — Telephone Encounter (Signed)
A Prior Authorization was initiated for this patients TESTOSTERONE INJECTION through CoverMyMeds.   Key: [id number]

## 2022-01-16 ENCOUNTER — Telehealth: Payer: Self-pay | Admitting: Family Medicine

## 2022-01-16 ENCOUNTER — Other Ambulatory Visit (HOSPITAL_COMMUNITY): Payer: Self-pay

## 2022-01-16 NOTE — Telephone Encounter (Signed)
Called with results--findings show patella alta--no clinical history suggestive of patellar tendon injury but I do suspect PFS contributing. Discussed PT, has follow up with sports medicine. All questions answered.   Terisa Starr, MD  Family Medicine Teaching Service

## 2022-01-16 NOTE — Telephone Encounter (Signed)
PA denied.   Reason: You do not meet the requirements of your plan. 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 Will disregard any future PA's for this pt's testosterone

## 2022-01-17 ENCOUNTER — Ambulatory Visit: Payer: BC Managed Care – PPO | Admitting: Sports Medicine

## 2022-01-17 ENCOUNTER — Other Ambulatory Visit: Payer: Self-pay

## 2022-01-17 VITALS — BP 122/86 | Ht 67.0 in | Wt 240.0 lb

## 2022-01-17 DIAGNOSIS — G8929 Other chronic pain: Secondary | ICD-10-CM

## 2022-01-17 DIAGNOSIS — Q682 Congenital deformity of knee: Secondary | ICD-10-CM | POA: Diagnosis not present

## 2022-01-17 DIAGNOSIS — M25562 Pain in left knee: Secondary | ICD-10-CM | POA: Diagnosis not present

## 2022-01-17 NOTE — Patient Instructions (Signed)
It was great to meet you today!  For your left knee we are going to refer you to physical therapy. They will call you to schedule but please let us know if you don't hear from them within a week.  Please call your insurance company to ask about cost of MRI. The procedure code, or CPT code, they'll ask for is 73721 MRI lower joint w/o contrast.  Follow up with [place] 4 weeks after you start physical therapy.  Call with any questions in the meantime.

## 2022-01-17 NOTE — Progress Notes (Signed)
° °  Subjective:    Patient ID: Tony Sharp, adult    DOB: 02-21-1996, 26 y.o.   MRN: [id number]  HPI chief complaint: Left knee pain  Tony Sharp is a very pleasant 26 year old that presents today with chronic medial sided left knee pain.  He has had pain on and off for several years.  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  When his symptoms began to worsen a few weeks ago he saw his primary care provider.  She ordered x-rays of his left knee which are available for my review.  He localizes all of his pain to the medial knee.  It is present now with walking, rest, and sleeping at night.  He does endorse intermittent swelling.  No prior knee surgeries.  He has not tried any specific treatment to date for this.  Past medical history reviewed Medications reviewed Allergies reviewed    Review of Systems As above    Objective:   Physical Exam  Well-developed, well-nourished.  Left knee: Full range of motion.  There is a trace effusion.  He is tender to palpation along the medial joint line but a negative McMurray's.  Positive Thessaly's.  No tenderness along the lateral joint line.  Negative Clark's compression test.  Knee is stable to valgus and varus stressing.  Negative anterior drawer, negative posterior drawer.  Neurovascular intact distally.  Evaluation of his gait shows some slight intoeing on the right but a neutral foot strike on the left.  X-rays of the left knee including AP, lateral, sunrise, and 30 degree flexion views shows patella alta with some mild medial joint space narrowing  Limited MSK ultrasound of the left knee does show a very small knee effusion.  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  We are going to start with physical therapy for treatment of his patella alta.  I would like  for him to follow-up with me 4 weeks afterwards for check on his progress.  If physical therapy is ineffective then we will reconsider merits of MRI.  He may also use over-the-counter NSAIDs as needed for pain or swelling.  Call with questions or concerns in the interim.  This note was dictated using Dragon naturally speaking software and may contain errors in syntax, spelling, or content which have not been identified prior to signing this note.

## 2022-02-15 NOTE — Therapy (Signed)
OUTPATIENT PHYSICAL THERAPY LOWER EXTREMITY EVALUATION   Patient Name: Tony Sharp MRN: [id number] DOB:December 23, 1995, 26 y.o., adult Today's Date: 02/16/2022   PT End of Session - 02/16/22 1315     Visit Number 1    Number of Visits 16    Date for PT Re-Evaluation 04/13/22    Authorization Type BC[employer] FOTO 6th and 10th visit    PT Start Time 1103    PT Stop Time 1146    PT Time Calculation (min) 43 min    Activity Tolerance Patient tolerated treatment well    Behavior During Therapy [facility] for tasks assessed/performed             Past Medical History:  Diagnosis Date   ADHD    Anxiety    Gender dysphoria    Past Surgical History:  Procedure Laterality Date   MASTECTOMY     Patient Active Problem List   Diagnosis Date Noted   Recurrent cystitis 02/16/2021   MDD (major depressive disorder) 02/16/2021   Vitamin B12 deficiency 08/25/2020   Insomnia 06/14/2020   Male-to-male transgender person 05/24/2018    PCP: Westley Chandler, MD  REFERRING PROVIDER: Ralene Cork, DO  REFERRING DIAG: [phone] (ICD-10-CM) - Chronic pain of left knee  THERAPY DIAG:  Chronic pain of left knee - Plan: PT plan of care cert/re-cert  Muscle weakness (generalized) - Plan: PT plan of care cert/re-cert  ONSET DATE: I have had pain since college and high school. But increasing pain since 2016  SUBJECTIVE:   SUBJECTIVE STATEMENT: I went to Dr Manson Passey 18 months ago but since November it has increased with bending my knee.  Consistently painful, I aggravated it on Saturday at work up and down a ladder at [employer]. 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  I also like to sit with my knees folded under me while side sitting and my knees hurt after that.  I would like to get back to some kind of exercise but my knee pain keeps me from participating right now  PERTINENT HISTORY: Mastectomy,male to male transgender. No issues  remarkable affecting knee except obesity, Left knee valgus  PAIN:  Are you having pain? Yes NPRS scale: 5/10 at worst deep bending or max extension 7/10 Pain location: Left knee only but patella alta bil Pain orientation: Left, Medial, and Anterior  PAIN TYPE: chronic Pain description: intermittent sharp  Aggravating factors: my pain increases as I straighten my knee all the way and at the end of bending. Stairs,  If I squat deeply it is hard for me to get up. Climbing a ladder for work. Unable to exercise now. Used to enjoy elliptical Relieving factors: not moving my knee, not sitting too long  PRECAUTIONS: None  WEIGHT BEARING RESTRICTIONS No  FALLS:  Has patient fallen in last 6 months? No, Number of falls: 0  LIVING ENVIRONMENT: Lives with: lives with an adult companion Lives in: House/apartment Stairs: Yes; External: 20 steps; can reach both Has following equipment at home: None  OCCUPATION: logistics at [employer]  PLOF: Independent  PATIENT GOALS Pt would like to get back to gym without pain and exercise and work life and social life without pain   OBJECTIVE:   DIAGNOSTIC FINDINGS: IMPRESSION:01-13-22 1. Moderate patella alta. Left knee 2. Mild medial compartment joint space narrowing.  PATIENT SURVEYS:  LEFS 44/80 FOTO 49%  predicted 70%  COGNITION:  Overall cognitive status: Within functional limits for tasks assessed     SENSATION:  Light touch: Appears intact  Stereognosis: Appears intact  Hot/Cold: Appears intact  Proprioception: Appears intact  MUSCLE LENGTH: Hamstrings: Right 55 deg; Left 56 deg Thomas test: Right 15 deg; Left 15 deg  POSTURE:  Increased lordotic curve, obesity, knee valgus Patella alta bil L>R  PALPATION: TTP  LE AROM/PROM:  A/PROM Right 02/16/2022 Left 02/16/2022  Hip flexion 120 120  Hip extension    Hip abduction    Hip adduction    Hip internal rotation    Hip external rotation    Knee flexion 128/135 128/130   Knee extension 0 0  Ankle dorsiflexion 8 10  Ankle plantarflexion    Ankle inversion    Ankle eversion     (Blank rows = not tested)  LE MMT:  MMT Right 02/16/2022 Left 02/16/2022  Hip flexion 4+ 4+  Hip extension 4 4  Hip abduction 4 4-  Hip adduction    Hip internal rotation    Hip external rotation    Knee flexion 4+ 4+  Knee extension 4+ 4  Ankle dorsiflexion    Ankle plantarflexion    Ankle inversion    Ankle eversion     (Blank rows = not tested)  LOWER EXTREMITY SPECIAL TESTS:  NT  FUNCTIONAL TESTS:  5 times sit to stand: 7.61 sec  normal  GAIT: Distance walked: 200 Assistive device utilized: None Level of assistance: Complete Independence Comments: Pt able to negotiate steps with alternating step pattern but pt does describe Left knee pain with with ascending and descending steps    TODAY'S TREATMENT: Mc Connell taping of left knee patella alta and fat pad taping   PATIENT EDUCATION:  Education details: POC Explanation of findings, Mc connell taping and FOTO report with handout Person educated: Patient Education method: Medical illustrator Education comprehension: verbalized understanding and needs further education   HOME EXERCISE PROGRAM: Pt needs HEP to be issued at 2nd visit  Access Code: [email or web address] URL: [email or web address] Date: 02/16/2022 Prepared by: Garen Lah  Exercises Sitting Knee Extension with Resistance - 1 x daily - 7 x weekly - 3 sets - 10 reps sidelying hip abduction ankle sock against wall - 1 x daily - 7 x weekly - 3 sets - 10 reps Supine Bridge - 1 x daily - 7 x weekly - 3 sets - 10 reps Standing Terminal Knee Extension with Resistance - 1 x daily - 7 x weekly - 3 sets - 10 reps Spanish or Goblet squats - 1 x daily - 7 x weekly - 3 sets - 10 reps   ASSESSMENT:  CLINICAL IMPRESSION: Patient is a 26 y.o. 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  Pt reports knee discomfort since high school /college but knee pain has been exacerbating since November.  Dr Margaretha Sheffield has dx pt with patellar alta  who was seen today for physical therapy evaluation and treatment for  sign and symptoms of patella alta with fat pad irritation on medial left knee.    OBJECTIVE IMPAIRMENTS decreased activity tolerance, decreased ROM, decreased strength, increased fascial restrictions, obesity, and pain.   ACTIVITY LIMITATIONS occupation.   PERSONAL FACTORS obesity, Left knee valgus are also affecting patient's functional outcome.    REHAB POTENTIAL: Good  CLINICAL DECISION MAKING: Evolving/moderate complexity  EVALUATION COMPLEXITY:  Moderate   GOALS: Goals reviewed with patient? Yes  SHORT TERM GOALS:  STG Name Target Date Goal status  1 Pt will be independent with initial HEP Baseline: no knowledge 03/09/2022 INITIAL  2 Pt will decreased pain with stair climbing to 3/10 or less Baseline: 7/10 03/09/2022 INITIAL  3 Pt will be instructed in basic  strengthening principles using weights and gym equipment  Baseline: no knowledge 03/09/2022 INITIAL  4 Pt will begin walking program for at least 1 mile 3 x a week Baseline:no exercise at present 03/09/2022 INITIAL   LONG TERM GOALS:   LTG Name Target Date Goal status  1 Pt will be independent with advanced HEP Baseline:no knowledge 04/13/2022 INITIAL  2 Patient will report improved functional level on LEFS >/= 70/80 in order to allow for return to no pain/minimal pain with work activities  Baseline: LEFS 44/80 04/13/2022 INITIAL  3 Pt will be able to cross legs to don shoes on without pain Baseline:Pt has pain while crossing legs 04/13/2022 INITIAL  4 Pt will be able to climb ladder without exacerbating pain for work duties Baseline: Pt with 7/10 pain climbing ladder 04/13/2022 INITIAL  5 Pt will be able to incorporate  walking program 3 days a week for 1-3 miles Baseline:no exercise 04/13/2022 INITIAL  6 FOTO will improve from  49%  to 70%    indicating improved functional mobility.  Baseline: 04/13/2022 INITIAL   PLAN: PT FREQUENCY: 2x/week  PT DURATION: 8 weeks  PLANNED INTERVENTIONS: Therapeutic exercises, Therapeutic activity, Neuromuscular re-education, Balance training, Gait training, Patient/Family education, Joint mobilization, Stair training, Dry Needling, Electrical stimulation, Cryotherapy, Moist heat, Taping, Ionotophoresis 4mg /ml Dexamethasone, and Manual therapy  PLAN FOR NEXT SESSION: issue HEP and check taping teach patient taping   Garen Lah, PT, ATRIC Certified Exercise Expert for the Aging Adult  02/16/22 1:51 PM Phone: [phone] Fax: [phone]

## 2022-02-16 ENCOUNTER — Encounter: Payer: Self-pay | Admitting: Physical Therapy

## 2022-02-16 ENCOUNTER — Other Ambulatory Visit: Payer: Self-pay

## 2022-02-16 ENCOUNTER — Ambulatory Visit: Payer: BC Managed Care – PPO | Attending: Sports Medicine | Admitting: Physical Therapy

## 2022-02-16 DIAGNOSIS — G8929 Other chronic pain: Secondary | ICD-10-CM | POA: Insufficient documentation

## 2022-02-16 DIAGNOSIS — M25562 Pain in left knee: Secondary | ICD-10-CM | POA: Diagnosis not present

## 2022-02-16 DIAGNOSIS — M6281 Muscle weakness (generalized): Secondary | ICD-10-CM | POA: Insufficient documentation

## 2022-02-16 NOTE — Therapy (Signed)
OUTPATIENT PHYSICAL THERAPY TREATMENT NOTE   Patient Name: Tony Sharp MRN: [id number] DOB:[birth year], 26 y.o., adult Today's Date: 02/21/2022  PCP: Westley Chandler, MD REFERRING PROVIDER: Westley Chandler, MD   PT End of Session - 02/21/22 1109     Visit Number 2    Number of Visits 16    Date for PT Re-Evaluation 04/13/22    Authorization Type [employer] FOTO 6th and 10th visit    PT Start Time 1106    PT Stop Time 1145    PT Time Calculation (min) 39 min    Activity Tolerance Patient tolerated treatment well    Behavior During Therapy [facility] for tasks assessed/performed             Past Medical History:  Diagnosis Date   ADHD    Anxiety    Gender dysphoria    Past Surgical History:  Procedure Laterality Date   MASTECTOMY     Patient Active Problem List   Diagnosis Date Noted   Recurrent cystitis 02/16/2021   MDD (major depressive disorder) 02/16/2021   Vitamin B12 deficiency 08/25/2020   Insomnia 06/14/2020   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 No issues remarkable affecting knee except obesity, Left knee valgus  PRECAUTIONS: none  SUBJECTIVE: Tape was OK but did not know much difference going up stairs.  Last night I had pain going to sleep, No real incident caused it.  Pain fluctuates  PAIN:  Are you having pain?No NPRS scale: today 0/10  but 5/10 at worst deep bending or max extension 7/10 Pain location: Left knee only but patella alta bil Pain orientation: Left, Medial, and Anterior  PAIN TYPE: chronic Pain description: intermittent sharp  Aggravating factors: my pain increases as I straighten my knee all the way and at the end of bending. Stairs,  If I squat deeply it is hard for me to get up. Climbing a ladder for work. Unable to exercise  now. Used to enjoy elliptical Relieving factors: not moving my knee, not sitting too long    OBJECTIVE:    DIAGNOSTIC FINDINGS: IMPRESSION:01-13-22 1. Moderate patella alta. Left knee 2. Mild medial compartment joint space narrowing.   PATIENT SURVEYS:  LEFS 44/80 FOTO 49%  predicted 70%   COGNITION:          Overall cognitive status: Within functional limits for tasks assessed                        SENSATION:          Light touch: Appears intact          Stereognosis: Appears intact          Hot/Cold: Appears intact          Proprioception: Appears intact   MUSCLE LENGTH: Hamstrings: Right 55 deg; Left 56 deg Thomas test: Right 15 deg; Left 15 deg   POSTURE:  Increased lordotic curve, obesity, knee valgus Patella alta bil L>R   PALPATION: TTP   LE AROM/PROM:   A/PROM Right 02/16/2022 Left 02/16/2022  Hip flexion 120 120  Hip extension      Hip abduction      Hip adduction      Hip internal rotation      Hip external rotation  Knee flexion 128/135 128/130  Knee extension 0 0  Ankle dorsiflexion 8 10  Ankle plantarflexion      Ankle inversion      Ankle eversion       (Blank rows = not tested)   LE MMT:   MMT Right 02/16/2022 Left 02/16/2022  Hip flexion 4+ 4+  Hip extension 4 4  Hip abduction 4 4-  Hip adduction      Hip internal rotation      Hip external rotation      Knee flexion 4+ 4+  Knee extension 4+ 4  Ankle dorsiflexion      Ankle plantarflexion      Ankle inversion      Ankle eversion       (Blank rows = not tested)   LOWER EXTREMITY SPECIAL TESTS:  NT   FUNCTIONAL TESTS:  5 times sit to stand: 7.61 sec  normal   GAIT: Distance walked: 200 Assistive device utilized: None Level of assistance: Complete Independence Comments: Pt able to negotiate steps with alternating step pattern but pt does describe Left knee pain with with ascending and descending steps       TODAY'S TREATMENT: [facility] Adult PT Treatment:                                                 DATE: 02-21-22 Therapeutic Exercise: Bil supine Hamstring  bridge curl on physio ball 2 x 12 Recumbent bike 6 min Level 3  Bridge with 20# DB 3 x 10 Box squat with 20 # 3 x 10 Single limb bridge on Left and Right 2 x 12 TKE left 3 x 10 GTB Sitting knee ext GTB 3 x 10 Right sidelying hip abduction with VC and TC  3 x 10   Eval 02-16-22 Mc Connell taping of left knee patella alta and fat pad taping     PATIENT EDUCATION:  Education details: POC Explanation of findings, Mc connell taping and FOTO report with handout, issued initial HEP Person educated: Patient Education method: Explanation and Demonstration Education comprehension: verbalized understanding and needs further education     HOME EXERCISE PROGRAM: Access Code: [email or web address] issued 2nd visit URL: [email or web address] Date: 02/21/2022 Prepared by: Garen Lah  Exercises Bridge with Hamstring Curl on Swiss Ball - 1 x daily - 7 x weekly - 3 sets - 10 reps Sitting Knee Extension with Resistance - 1 x daily - 7 x weekly - 3 sets - 10 reps sidelying hip abduction ankle sock against wall - 1 x daily - 7 x weekly - 3 sets - 10 reps Standing Terminal Knee Extension with Resistance - 1 x daily - 7 x weekly - 3 sets - 10 reps Spanish or Goblet squats - 1 x daily - 7 x weekly - 3 sets - 10 reps Single Leg Bridge - 1 x daily - 7 x weekly - 3 sets - 10 reps      ASSESSMENT:   CLINICAL IMPRESSION: [name] participates fully with initiation of HEP this visit. Pt will no pain  but did complain of pain at night with no incident 5/10.  Pt was able to perform all exercises and did show fatiguing at end of 3rd sets for the exercises given.  Will continue with Anne Ng for patella alta and possible taping , neoprene sleeve if strenthening exercises do  not decrease pain.  Pt is compliant and wants to build reserve for muscles.  Pt has access to school gym and would like to develop routine for bil  knees.   Patient is a 26 y.o. 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  Pt reports knee discomfort since high school /college but knee pain has been exacerbating since November.  Dr Margaretha Sheffield has dx pt with patellar alta  who was seen today for physical therapy evaluation and treatment for  sign and symptoms of patella alta with fat pad irritation on medial left knee.      OBJECTIVE IMPAIRMENTS decreased activity tolerance, decreased ROM, decreased strength, increased fascial restrictions, obesity, and pain.    ACTIVITY LIMITATIONS occupation.    PERSONAL FACTORS obesity, Left knee valgus are also affecting patient's functional outcome.      REHAB POTENTIAL: Good   CLINICAL DECISION MAKING: Evolving/moderate complexity   EVALUATION COMPLEXITY: Moderate     GOALS: Goals reviewed with patient? Yes   SHORT TERM GOALS:   STG Name Target Date Goal status  1 Pt will be independent with initial HEP Baseline: no knowledge issued 02-21-22 pt pain 0/10 03/09/2022 INITIAL  2 Pt will decreased pain with stair climbing to 3/10 or less Baseline: 7/10 03/09/2022 INITIAL  3 Pt will be instructed in basic  strengthening principles using weights and gym equipment  Baseline: no knowledge 03/09/2022 INITIAL  4 Pt will begin walking program for at least 1 mile 3 x a week Baseline:no exercise at present 03/09/2022 INITIAL    LONG TERM GOALS:    LTG Name Target Date Goal status  1 Pt will be independent with advanced HEP Baseline:no knowledge 04/13/2022 INITIAL  2 Patient will report improved functional level on LEFS >/= 70/80 in order to allow for return to no pain/minimal pain with work activities  Baseline: LEFS 44/80 04/13/2022 INITIAL  3 Pt will be able to cross legs to don shoes on without pain Baseline:Pt has pain while crossing legs 04/13/2022 INITIAL  4 Pt will be able  to climb ladder without exacerbating pain for work duties Baseline: Pt with 7/10 pain climbing ladder 04/13/2022 INITIAL  5 Pt will be able to incorporate walking program 3 days a week for 1-3 miles Baseline:no exercise 04/13/2022 INITIAL  6 FOTO will improve from  49%  to 70%    indicating improved functional mobility.  Baseline: 04/13/2022 INITIAL    PLAN: PT FREQUENCY: 2x/week   PT DURATION: 8 weeks   PLANNED INTERVENTIONS: Therapeutic exercises, Therapeutic activity, Neuromuscular re-education, Balance training, Gait training, Patient/Family education, Joint mobilization, Stair training, Dry Needling, Electrical stimulation, Cryotherapy, Moist heat, Taping, Ionotophoresis 4mg /ml Dexamethasone, and Manual therapy   PLAN FOR NEXT SESSION:     Please check for any tape or perhaps neoprene sleeve option for patella alta  working on strengthening instead of bracing but may need to if strengthening not fully effective. Pt still having pain going up steps.         , PT, ATRIC Certified Exercise Expert for the Aging Adult  02/21/22 11:56 AM Phone: [phone] Fax: [phone]

## 2022-02-21 ENCOUNTER — Encounter: Payer: Self-pay | Admitting: Physical Therapy

## 2022-02-21 ENCOUNTER — Other Ambulatory Visit: Payer: Self-pay

## 2022-02-21 ENCOUNTER — Ambulatory Visit: Payer: BC Managed Care – PPO | Admitting: Physical Therapy

## 2022-02-21 DIAGNOSIS — M6281 Muscle weakness (generalized): Secondary | ICD-10-CM

## 2022-02-21 DIAGNOSIS — G8929 Other chronic pain: Secondary | ICD-10-CM

## 2022-02-21 DIAGNOSIS — M25562 Pain in left knee: Secondary | ICD-10-CM | POA: Diagnosis not present

## 2022-02-22 NOTE — Therapy (Signed)
OUTPATIENT PHYSICAL THERAPY TREATMENT NOTE   Patient Name: Tony Sharp MRN: [id number] DOB:21-Jul-1996, 26 y.o., adult Today's Date: 02/23/2022  PCP: Martyn Malay, MD REFERRING PROVIDER: Thurman Coyer, DO   PT End of Session - 02/23/22 1103     Visit Number 3    Number of Visits 16    Date for PT Re-Evaluation 04/13/22    Authorization Type [employer] FOTO 6th and 10th visit    PT Start Time 1104    PT Stop Time 1145    PT Time Calculation (min) 41 min    Activity Tolerance Patient tolerated treatment well    Behavior During Therapy [facility] for tasks assessed/performed              Past Medical History:  Diagnosis Date   ADHD    Anxiety    Gender dysphoria    Past Surgical History:  Procedure Laterality Date   MASTECTOMY     Patient Active Problem List   Diagnosis Date Noted   Recurrent cystitis 02/16/2021   MDD (major depressive disorder) 02/16/2021   Vitamin B12 deficiency 08/25/2020   Insomnia 06/14/2020   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 No issues remarkable affecting knee except obesity, Left knee valgus  PRECAUTIONS: none   SUBJECTIVE: "A tiny bit of pain."  PAIN:  Are you having pain? Yes VAS scale: 2/10 Pain location: medial knee Pain orientation: Left  Pain description: intermittent  Aggravating factors: bending Relieving factors: straightening out and relaxing        OBJECTIVE:    DIAGNOSTIC FINDINGS: IMPRESSION:01-13-22 1. Moderate patella alta. Left knee 2. Mild medial compartment joint space narrowing.   PATIENT SURVEYS:  LEFS 44/80 FOTO 49%  predicted 70%   COGNITION:          Overall cognitive status: Within functional limits for tasks assessed                        SENSATION:          Light touch:  Appears intact          Stereognosis: Appears intact          Hot/Cold: Appears intact          Proprioception: Appears intact   MUSCLE LENGTH: Hamstrings: Right 55 deg; Left 56 deg Thomas test: Right 15 deg; Left 15 deg   POSTURE:  Increased lordotic curve, obesity, knee valgus Patella alta bil L>R   PALPATION: TTP   LE AROM/PROM:   A/PROM Right 02/16/2022 Left 02/16/2022  Hip flexion 120 120  Hip extension      Hip abduction      Hip adduction      Hip internal rotation      Hip external rotation      Knee flexion 128/135 128/130  Knee extension 0 0  Ankle dorsiflexion 8 10  Ankle plantarflexion      Ankle inversion      Ankle eversion       (Blank rows = not tested)   LE MMT:   MMT Right 02/16/2022 Left 02/16/2022  Hip flexion 4+ 4+  Hip extension 4 4  Hip abduction 4 4-  Hip adduction      Hip internal rotation      Hip external rotation  Knee flexion 4+ 4+  Knee extension 4+ 4  Ankle dorsiflexion      Ankle plantarflexion      Ankle inversion      Ankle eversion       (Blank rows = not tested)   LOWER EXTREMITY SPECIAL TESTS:  NT   FUNCTIONAL TESTS:  5 times sit to stand: 7.61 sec  normal   GAIT: Distance walked: 200 Assistive device utilized: None Level of assistance: Complete Independence Comments: Pt able to negotiate steps with alternating step pattern but pt does describe Left knee pain with with ascending and descending steps       TODAY'S TREATMENT: [facility] Adult PT Treatment:                                                DATE: 02/23/22 Therapeutic Exercise: SAQ on bolster with ball squeeze 1x10 SLR with ER for VMO activation 2 x12 Wall squats with ball squeeze between knees 2 x 10 Sidestepping with GTB around ankles x5 Standing hip EXT with GTB 2x10 each leg Manual Therapy: IASTM to vastus lateralis and rectus femoris Tape left knee to correct lateral tilt and glide Self Care: Discussed purpose of tape and removal of tape in  approximately 2 days when tape is wet.   [facility] Adult PT Treatment:                                                DATE: 02-21-22 Therapeutic Exercise: Bil supine Hamstring  bridge curl on physio ball 2 x 12 Recumbent bike 6 min Level 3  Bridge with 20# DB 3 x 10 Box squat with 20 # 3 x 10 Single limb bridge on Left and Right 2 x 12 TKE left 3 x 10 GTB Sitting knee ext GTB 3 x 10 Right sidelying hip abduction with VC and TC  3 x 10   Eval 02-16-22 Mc Connell taping of left knee patella alta and fat pad taping     PATIENT EDUCATION:  Education details: POC Explanation of findings, Mc connell taping and FOTO report with handout, issued initial HEP Person educated: Patient Education method: Customer service manager Education comprehension: verbalized understanding and needs further education     HOME EXERCISE PROGRAM: Access Code: [email or web address] URL: [email or web address] Date: 02/23/2022 Prepared by: Edythe Lynn  Exercises Bridge with Hamstring Curl on Swiss Ball - 1 x daily - 7 x weekly - 3 sets - 10 reps Sitting Knee Extension with Resistance - 1 x daily - 7 x weekly - 3 sets - 10 reps sidelying hip abduction ankle sock against wall - 1 x daily - 7 x weekly - 3 sets - 10 reps Standing Terminal Knee Extension with Resistance - 1 x daily - 7 x weekly - 3 sets - 10 reps Spanish or Goblet squats - 1 x daily - 7 x weekly - 3 sets - 10 reps Single Leg Bridge - 1 x daily - 7 x weekly - 3 sets - 10 reps Hip Extension with Resistance Loop - 1 x daily - 7 x weekly - 2 sets - 10 reps Side Stepping with Resistance at Ankles- 1 x daily - 7 x weekly - 3 sets  ASSESSMENT:   CLINICAL IMPRESSION: [name] tolerated session very well today. Palpable tightness noted in left rectus femoris and vastus lateralis. McConnell taping placed on left knee to help with lateral patellar tilt and glide. He demonstrated fatigue with hip strengthening exercises.       OBJECTIVE IMPAIRMENTS  decreased activity tolerance, decreased ROM, decreased strength, increased fascial restrictions, obesity, and pain.    ACTIVITY LIMITATIONS occupation.    PERSONAL FACTORS obesity, Left knee valgus are also affecting patient's functional outcome.      REHAB POTENTIAL: Good   CLINICAL DECISION MAKING: Evolving/moderate complexity   EVALUATION COMPLEXITY: Moderate     GOALS: Goals reviewed with patient? Yes   SHORT TERM GOALS:   STG Name Target Date Goal status  1 Pt will be independent with initial HEP Baseline: no knowledge issued 02-21-22 pt pain 0/10 03/09/2022 INITIAL  2 Pt will decreased pain with stair climbing to 3/10 or less Baseline: 7/10 03/09/2022 INITIAL  3 Pt will be instructed in basic  strengthening principles using weights and gym equipment  Baseline: no knowledge 03/09/2022 INITIAL  4 Pt will begin walking program for at least 1 mile 3 x a week Baseline:no exercise at present 03/09/2022 INITIAL    LONG TERM GOALS:    LTG Name Target Date Goal status  1 Pt will be independent with advanced HEP Baseline:no knowledge 04/13/2022 INITIAL  2 Patient will report improved functional level on LEFS >/= 70/80 in order to allow for return to no pain/minimal pain with work activities  Baseline: LEFS 44/80 04/13/2022 INITIAL  3 Pt will be able to cross legs to don shoes on without pain Baseline:Pt has pain while crossing legs 04/13/2022 INITIAL  4 Pt will be able to climb ladder without exacerbating pain for work duties Baseline: Pt with 7/10 pain climbing ladder 04/13/2022 INITIAL  5 Pt will be able to incorporate walking program 3 days a week for 1-3 miles Baseline:no exercise 04/13/2022 INITIAL  6 FOTO will improve from  49%  to 70%    indicating improved functional mobility.  Baseline: 04/13/2022 INITIAL    PLAN: PT FREQUENCY: 2x/week   PT DURATION: 8 weeks   PLANNED INTERVENTIONS: Therapeutic exercises, Therapeutic activity, Neuromuscular re-education, Balance training,  Gait training, Patient/Family education, Joint mobilization, Stair training, Dry Needling, Electrical stimulation, Cryotherapy, Moist heat, Taping, Ionotophoresis 4mg /ml Dexamethasone, and Manual therapy   PLAN FOR NEXT SESSION:     Please check for any tape or perhaps neoprene sleeve option for patella alta working on strengthening instead of bracing but may need to if strengthening not fully effective. Pt still having pain going up steps. Continue with hip and VMO strengthening.      Edythe Lynn, PT, DPT 02/23/22 11:54 AM

## 2022-02-23 ENCOUNTER — Ambulatory Visit: Payer: BC Managed Care – PPO | Admitting: Physical Therapy

## 2022-02-23 ENCOUNTER — Encounter: Payer: Self-pay | Admitting: Physical Therapy

## 2022-02-23 ENCOUNTER — Other Ambulatory Visit: Payer: Self-pay

## 2022-02-23 DIAGNOSIS — M25562 Pain in left knee: Secondary | ICD-10-CM

## 2022-02-23 DIAGNOSIS — M6281 Muscle weakness (generalized): Secondary | ICD-10-CM

## 2022-02-28 ENCOUNTER — Other Ambulatory Visit: Payer: Self-pay

## 2022-02-28 ENCOUNTER — Encounter: Payer: Self-pay | Admitting: Physical Therapy

## 2022-02-28 ENCOUNTER — Ambulatory Visit: Payer: BC Managed Care – PPO | Admitting: Physical Therapy

## 2022-02-28 DIAGNOSIS — G8929 Other chronic pain: Secondary | ICD-10-CM

## 2022-02-28 DIAGNOSIS — M25562 Pain in left knee: Secondary | ICD-10-CM | POA: Diagnosis not present

## 2022-02-28 DIAGNOSIS — M6281 Muscle weakness (generalized): Secondary | ICD-10-CM

## 2022-02-28 NOTE — Therapy (Signed)
?OUTPATIENT PHYSICAL THERAPY TREATMENT NOTE ? ? ?Patient Name: Tony Sharp ?MRN: [id number] ?DOB:28-Mar-1996, 26 y.o., adult ?Today's Date: 02/28/2022 ? ?PCP: Westley Chandler, MD ?REFERRING PROVIDER: Westley Chandler, MD ? ? PT End of Session - 02/28/22 1326   ? ? Visit Number 4   ? Number of Visits 16   ? Date for PT Re-Evaluation 04/13/22   ? Authorization Type [employer] FOTO 6th and 10th visit   ? PT Start Time 1328   ? PT Stop Time 1415   ? PT Time Calculation (min) 47 min   ? Activity Tolerance Patient tolerated treatment well   ? Behavior During Therapy [facility] for tasks assessed/performed   ? ?  ?  ? ?  ? ? ? ? ?Past Medical History:  ?Diagnosis Date  ? ADHD   ? Anxiety   ? Gender dysphoria   ? ?Past Surgical History:  ?Procedure Laterality Date  ? MASTECTOMY    ? ?Patient Active Problem List  ? Diagnosis Date Noted  ? Recurrent cystitis 02/16/2021  ? MDD (major depressive disorder) 02/16/2021  ? Vitamin B12 deficiency 08/25/2020  ? Insomnia 06/14/2020  ? 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 ? ?REFERRING DIAG: M25.562,G89.29 (ICD-10-CM) - Chronic pain of left knee ? ?THERAPY DIAG:  ?Chronic pain of left knee ? ?Muscle weakness (generalized) ? ?PERTINENT HISTORY: Mastectomy,male to male transgender. No issues remarkable affecting knee except obesity, Left knee valgus ? ?PRECAUTIONS: none ? ? ?SUBJECTIVE: "The tape last time I wasn't sure if it helped or hurt worse. I've been doing more walking and all the exercises and doing walking on the treadmill ? ?PAIN:  ?Are you having pain? Yes: NPRS scale: 4/10 ?Pain location: L medial knee ?Pain description: aching ?Aggravating factors: walking/ standing ?Relieving factors: sitting and resting ? ? ?OBJECTIVE:  ?  ?DIAGNOSTIC FINDINGS: IMPRESSION:01-13-22 ?1. Moderate patella alta. Left knee ?2. Mild medial compartment joint space narrowing. ?  ?PATIENT SURVEYS:  ?LEFS 44/80 ?FOTO 49%  predicted 70% ?  ?COGNITION: ?         Overall cognitive status: Within functional  limits for tasks assessed              ?          ?SENSATION: ?         Light touch: Appears intact ?         Stereognosis: Appears intact ?         Hot/Cold: Appears intact ?         Proprioception: Appears intact ?  ?MUSCLE LENGTH: ?Hamstrings: Right 55 deg; Left 56 deg ?Thomas test: Right 15 deg; Left 15 deg ?  ?POSTURE:  ?Increased lordotic curve, obesity, knee valgus ?Patella alta bil L>R ?  ?PALPATION: ?TTP medial joint line, and peri-patellar ?  ?LE AROM/PROM: ?  ?A/PROM Right ?02/16/2022 Left ?02/16/2022  ?Hip flexion 120 120  ?Hip extension      ?Hip abduction      ?Hip adduction      ?Hip internal rotation      ?Hip external rotation      ?Knee flexion 128/135 128/130  ?Knee extension 0 0  ?Ankle dorsiflexion 8 10  ?Ankle plantarflexion      ?Ankle inversion      ?Ankle eversion      ? (Blank rows = not tested) ?  ?LE MMT: ?  ?MMT Right ?02/16/2022 Left ?02/16/2022  ?Hip flexion 4+ 4+  ?Hip extension 4 4  ?Hip abduction 4  4-  ?Hip adduction      ?Hip internal rotation      ?Hip external rotation      ?Knee flexion 4+ 4+  ?Knee extension 4+ 4  ?Ankle dorsiflexion      ?Ankle plantarflexion      ?Ankle inversion      ?Ankle eversion      ? (Blank rows = not tested) ?  ?LOWER EXTREMITY SPECIAL TESTS:  ?NT ?02/28/2022 ?R: 1.5 cm navicular drop ?L: 1.3 cm Navculare drop ? ?FUNCTIONAL TESTS:  ?5 times sit to stand: 7.61 sec  normal ?  ?GAIT: ?Distance walked: 200 ?Assistive device utilized: None ?Level of assistance: Complete Independence ?Comments: Pt able to negotiate steps with alternating step pattern but pt does describe Left knee pain with with ascending and descending steps ?  ?  ?  ?TODAY'S TREATMENT: ? ?[facility] Adult PT Treatment:                                                DATE: 02/28/2022 ?Therapeutic Exercise: ?Bil hip abduction 3 x 15 in Sidelying  ?Hip ER 2 x 15 bil  ?Posterior tib strengthening bil with heel raise with ball squeeze between the ankles for posterior tib activation  ?LAQ with ball squeeze to  facilitate VMO activation 2 x 15 ?Manual Therapy: ?Bil arch taping to reduce navicular drop ?MTPR along vastus lateralis  ?Neuro-red: ?Gait training focusing on keeping knee in alignment with the foot ?Standing with hands on the wall marching back / forth with focus on keeping knee in neutral position avoiing excessive med/ lateral movement 1 x 20 progressing to walking keeping that same biomechanics which the pt noted improvement in pain. Walking 4 x 50 ft ? ? ? ? ?[facility] Adult PT Treatment:                                                DATE: 02/23/22 ?Therapeutic Exercise: ?SAQ on bolster with ball squeeze 1x10 ?SLR with ER for VMO activation 2 x12 ?Wall squats with ball squeeze between knees 2 x 10 ?Sidestepping with GTB around ankles x5 ?Standing hip EXT with GTB 2x10 each leg ?Manual Therapy: ?IASTM to vastus lateralis and rectus femoris ?Tape left knee to correct lateral tilt and glide ?Self Care: ?Discussed purpose of tape and removal of tape in approximately 2 days when tape is wet.  ? ?[facility] Adult PT Treatment:                                                DATE: 02-21-22 ?Therapeutic Exercise: ?Bil supine Hamstring  bridge curl on physio ball 2 x 12 ?Recumbent bike 6 min Level 3  ?Bridge with 20# DB 3 x 10 ?Box squat with 20 # 3 x 10 ?Single limb bridge on Left and Right 2 x 12 ?TKE left 3 x 10 GTB ?Sitting knee ext GTB 3 x 10 ?Right sidelying hip abduction with VC and TC  3 x 10 ? ? Eval 02-16-22 Cherly Anderson taping of left knee patella alta and fat pad taping ?  ?  ?  PATIENT EDUCATION:  ?Education details: POC Explanation of findings, Mc connell taping and FOTO report with handout, issued initial HEP ?Person educated: Patient ?Education method: Explanation and Demonstration ?Education comprehension: verbalized understanding and needs further education ?  ?  ?HOME EXERCISE PROGRAM: ?Access Code: [email or web address] ?URL: [email or web address] ?Date: 02/23/2022 ?Prepared by: Edythe Lynn ? ?Exercises ?Bridge  with Hamstring Curl on [employer] - 1 x daily - 7 x weekly - 3 sets - 10 reps ?Sitting Knee Extension with Resistance - 1 x daily - 7 x weekly - 3 sets - 10 reps ?sidelying hip abduction ankle sock against wall - 1 x daily - 7 x weekly - 3 sets - 10 reps ?Standing Terminal Knee Extension with Resistance - 1 x daily - 7 x weekly - 3 sets - 10 reps ?Spanish or Goblet squats - 1 x daily - 7 x weekly - 3 sets - 10 reps ?Single Leg Bridge - 1 x daily - 7 x weekly - 3 sets - 10 reps ?Hip Extension with Resistance Loop - 1 x daily - 7 x weekly - 2 sets - 10 reps ?Side Stepping with Resistance at Ankles- 1 x daily - 7 x weekly - 3 sets ? ? ?  ?  ?ASSESSMENT: ?  ?CLINICAL IMPRESSION: ?Pt arrives to session with soreness rated at 3-4/10 along the L medial joint line. Further assessment revealed navicular drop with R at 1.5 cm , and L 1.3 cm. Trialed arch taping to promote arch support with wtanidng and walking and reduce internal rotation with walking. Continued working on [employer] activation techniques and glute med activation as well.  Practiced gait training to reduce valgus positioning of the knee and keeping the knee in line with the foot. ? ?  ?  ?OBJECTIVE IMPAIRMENTS decreased activity tolerance, decreased ROM, decreased strength, increased fascial restrictions, obesity, and pain.  ?  ?ACTIVITY LIMITATIONS occupation.  ?  ?PERSONAL FACTORS obesity, Left knee valgus are also affecting patient's functional outcome.  ?  ?  ?REHAB POTENTIAL: Good ?  ?CLINICAL DECISION MAKING: Evolving/moderate complexity ?  ?EVALUATION COMPLEXITY: Moderate ?  ?  ?GOALS: ?Goals reviewed with patient? Yes ?  ?SHORT TERM GOALS: ?  ?STG Name Target Date Goal status  ?1 Pt will be independent with initial HEP ?Baseline: no knowledge issued 02-21-22 pt pain 0/10 03/09/2022 INITIAL  ?2 Pt will decreased pain with stair climbing to 3/10 or less ?Baseline: 7/10 03/09/2022 INITIAL  ?3 Pt will be instructed in basic  strengthening principles using weights  and gym equipment  ?Baseline: no knowledge 03/09/2022 INITIAL  ?4 Pt will begin walking program for at least 1 mile 3 x a week ?Baseline:no exercise at present 03/09/2022 INITIAL  ?  ?LONG TERM GOALS:  ?  ?L

## 2022-03-02 ENCOUNTER — Other Ambulatory Visit: Payer: Self-pay

## 2022-03-02 ENCOUNTER — Encounter: Payer: Self-pay | Admitting: Physical Therapy

## 2022-03-02 ENCOUNTER — Ambulatory Visit: Payer: BC Managed Care – PPO | Admitting: Physical Therapy

## 2022-03-02 DIAGNOSIS — M25562 Pain in left knee: Secondary | ICD-10-CM | POA: Diagnosis not present

## 2022-03-02 NOTE — Therapy (Addendum)
?OUTPATIENT PHYSICAL THERAPY TREATMENT NOTE ? ? ?Patient Name: Tony PughSeth A Sharp ?MRN: [id number] ?DOB:01/09/1996, 26 y.o., adult ?Today's Date: 03/02/2022 ? ?PCP: Westley ChandlerBrown, Carina M, MD ?REFERRING PROVIDER: Ralene Corkraper, Timothy R, DO ? ? PT End of Session - 03/02/22 1105   ? ? Visit Number 5   ? Number of Visits 16   ? Date for PT Re-Evaluation 04/13/22   ? Authorization Type [employer] FOTO 6th and 10th visit   ? PT Start Time 1104   ? PT Stop Time 1145   ? PT Time Calculation (min) 41 min   ? Activity Tolerance Patient tolerated treatment well   ? [facility] assessed/performed   ? ?  ?  ? ?  ? ? ? ? ? ?Past Medical History:  ?Diagnosis Date  ? ADHD   ? Anxiety   ? Gender dysphoria   ? ?Past Surgical History:  ?Procedure Laterality Date  ? MASTECTOMY    ? ?Patient Active Problem List  ? Diagnosis Date Noted  ? Recurrent cystitis 02/16/2021  ? MDD (major depressive disorder) 02/16/2021  ? Vitamin B12 deficiency 08/25/2020  ? Insomnia 06/14/2020  ? 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 ? ?REFERRING DIAG: M25.562,G89.29 (ICD-10-CM) - Chronic pain of left knee ? ?THERAPY DIAG:  ?Chronic pain of left knee ? ?Muscle weakness (generalized) ? ?PERTINENT HISTORY: Mastectomy,male to male transgender. No issues remarkable affecting knee except obesity, Left knee valgus ? ?PRECAUTIONS: none ? ?SUBJECTIVE: "I think the tape was okay, I am feeling okay today maybe a little soreness at 2/10. I am trying to work on walking straighter  ? ?PAIN:  ?Are you having pain? Yes: NPRS scale: 2/10 ?Pain location: R knee pain ?Pain description: aching ?Aggravating factors: standing/ walking ?Relieving factors: sitting/ resting.  ? ? ? ?OBJECTIVE:  ?  ?DIAGNOSTIC FINDINGS: IMPRESSION:01-13-22 ?1. Moderate patella alta. Left knee ?2. Mild medial compartment joint space narrowing. ?  ?PATIENT SURVEYS:  ?LEFS 44/80 ?FOTO 49%  predicted 70% ?  ?COGNITION: ?         Overall cognitive status: Within functional limits for tasks  assessed              ?          ?SENSATION: ?         Light touch: Appears intact ?         Stereognosis: Appears intact ?         Hot/Cold: Appears intact ?         Proprioception: Appears intact ?  ?MUSCLE LENGTH: ?Hamstrings: Right 55 deg; Left 56 deg ?Thomas test: Right 15 deg; Left 15 deg ?  ?POSTURE:  ?Increased lordotic curve, obesity, knee valgus ?Patella alta bil L>R ?  ?PALPATION: ?TTP medial joint line, and peri-patellar ?  ?LE AROM/PROM: ?  ?A/PROM Right ?02/16/2022 Left ?02/16/2022  ?Hip flexion 120 120  ?Hip extension      ?Hip abduction      ?Hip adduction      ?Hip internal rotation      ?Hip external rotation      ?Knee flexion 128/135 128/130  ?Knee extension 0 0  ?Ankle dorsiflexion 8 10  ?Ankle plantarflexion      ?Ankle inversion      ?Ankle eversion      ? (Blank rows = not tested) ?  ?LE MMT: ?  ?MMT Right ?02/16/2022 Left ?02/16/2022  ?Hip flexion 4+ 4+  ?Hip extension 4 4  ?Hip abduction 4 4-  ?  Hip adduction      ?Hip internal rotation      ?Hip external rotation      ?Knee flexion 4+ 4+  ?Knee extension 4+ 4  ?Ankle dorsiflexion      ?Ankle plantarflexion      ?Ankle inversion      ?Ankle eversion      ? (Blank rows = not tested) ?  ?LOWER EXTREMITY SPECIAL TESTS:  ?NT ?02/28/2022 ?R: 1.5 cm navicular drop ?L: 1.3 cm Navculare drop ? ?FUNCTIONAL TESTS:  ?5 times sit to stand: 7.61 sec  normal ?  ?GAIT: ?Distance walked: 200 ?Assistive device utilized: None ?Level of assistance: Complete Independence ?Comments: Pt able to negotiate steps with alternating step pattern but pt does describe Left knee pain with with ascending and descending steps ?  ?  ?  ?TODAY'S TREATMENT: ? ?[facility] Adult PT Treatment:                                                DATE: 03/02/2022 ?Therapeutic Exercise: ?SLR 2 x 20 with external rotation ?L hip abduction performing cirlce CW/CCW x 10 ea x 3 sets ?Posterior tib strengthening 1 x 20 bil with GTB ?L sidelying hip ER 2 x 20 with 4# ?Manual Therapy: ?Bil arch taping to  reduce navicular drop ?Low load long duration inferior patellar glide ?MTPR along vastus lateralis  ? ? ?[facility] Adult PT Treatment:                                                DATE: 02/28/2022 ?Therapeutic Exercise: ?Bil hip abduction 3 x 15 in Sidelying  ?Hip ER 2 x 15 bil  ?Posterior tib strengthening bil with heel raise with ball squeeze between the ankles for posterior tib activation  ?LAQ with ball squeeze to facilitate VMO activation 2 x 15 ?Manual Therapy: ?Bil arch taping to reduce navicular drop ?MTPR along vastus lateralis  ?Neuro-red: ?Gait training focusing on keeping knee in alignment with the foot ?Standing with hands on the wall marching back / forth with focus on keeping knee in neutral position avoiing excessive med/ lateral movement 1 x 20 progressing to walking keeping that same biomechanics which the pt noted improvement in pain. Walking 4 x 50 ft ? ? ? ? ?[facility] Adult PT Treatment:                                                DATE: 02/23/22 ?Therapeutic Exercise: ?SAQ on bolster with ball squeeze 1x10 ?SLR with ER for VMO activation 2 x12 ?Wall squats with ball squeeze between knees 2 x 10 ?Sidestepping with GTB around ankles x5 ?Standing hip EXT with GTB 2x10 each leg ?Manual Therapy: ?IASTM to vastus lateralis and rectus femoris ?Tape left knee to correct lateral tilt and glide ?Self Care: ?Discussed purpose of tape and removal of tape in approximately 2 days when tape is wet.  ? ?[facility] Adult PT Treatment:  DATE: 02-21-22 ?Therapeutic Exercise: ?Bil supine Hamstring  bridge curl on physio ball 2 x 12 ?Recumbent bike 6 min Level 3  ?Bridge with 20# DB 3 x 10 ?Box squat with 20 # 3 x 10 ?Single limb bridge on Left and Right 2 x 12 ?TKE left 3 x 10 GTB ?Sitting knee ext GTB 3 x 10 ?Right sidelying hip abduction with VC and TC  3 x 10 ? ? Eval 02-16-22 Antonietta Jewel taping of left knee patella alta and fat pad taping ?  ?  ?PATIENT EDUCATION:  ?Education  details: POC Explanation of findings, Mc connell taping and FOTO report with handout, issued initial HEP ?Person educated: Patient ?Education method: Explanation and Demonstration ?Education comprehension: verbalized understanding and needs further education ?  ?  ?HOME EXERCISE PROGRAM: ?Access Code: [email or web address] ?URL: [email or web address] ?Date: 03/02/2022 ?Prepared by: Lulu Riding ? ?Exercises ?Bridge with Hamstring Curl on [employer] - 1 x daily - 7 x weekly - 3 sets - 10 reps ?Sitting Knee Extension with Resistance - 1 x daily - 7 x weekly - 3 sets - 10 reps ?sidelying hip abduction ankle sock against wall - 1 x daily - 7 x weekly - 3 sets - 10 reps ?Standing Terminal Knee Extension with Resistance - 1 x daily - 7 x weekly - 3 sets - 10 reps ?Spanish or Goblet squats - 1 x daily - 7 x weekly - 3 sets - 10 reps ?Single Leg Bridge - 1 x daily - 7 x weekly - 3 sets - 10 reps ?Hip Extension with Resistance Loop - 1 x daily - 7 x weekly - 2 sets - 15 reps ?Side Stepping with Resistance at Thighs - 1 x daily - 7 x weekly - 3 sets - 15 reps ?Sidelying Hip External Rotation on Table - 1 x daily - 7 x weekly - 2 sets - 20 reps - 1 hold ?Sit to Stand with Resistance Around Legs - 1 x daily - 7 x weekly - 2 sets - 20 reps ? ? ? ?  ?  ?ASSESSMENT: ?  ?CLINICAL IMPRESSION: ?Pt reports pain today at 2/10 and notes difficulty assessing benefit of the arch tape applied last session. Continued application of taping to see benefit of arch support to promote knee positioning. Continued working on hip / knee strengthening with focus on proximal and distal LLE strengthening to reduce valgum with gait. Updated HEP today to promote piriformis activation and gross hip abd/ extension.  ? ?  ?  ?OBJECTIVE IMPAIRMENTS decreased activity tolerance, decreased ROM, decreased strength, increased fascial restrictions, obesity, and pain.  ?  ?ACTIVITY LIMITATIONS occupation.  ?  ?PERSONAL FACTORS obesity, Left knee valgus are  also affecting patient's functional outcome.  ?  ?  ?REHAB POTENTIAL: Good ?  ?CLINICAL DECISION MAKING: Evolving/moderate complexity ?  ?EVALUATION COMPLEXITY: Moderate ?  ?  ?GOALS: ?Goals reviewed with patient?

## 2022-03-07 ENCOUNTER — Ambulatory Visit: Payer: BC Managed Care – PPO | Admitting: Physical Therapy

## 2022-03-07 ENCOUNTER — Encounter: Payer: BC Managed Care – PPO | Admitting: Physical Therapy

## 2022-03-09 ENCOUNTER — Other Ambulatory Visit: Payer: Self-pay

## 2022-03-09 ENCOUNTER — Encounter: Payer: Self-pay | Admitting: Physical Therapy

## 2022-03-09 ENCOUNTER — Ambulatory Visit: Payer: BC Managed Care – PPO | Admitting: Physical Therapy

## 2022-03-09 DIAGNOSIS — M25562 Pain in left knee: Secondary | ICD-10-CM | POA: Diagnosis not present

## 2022-03-09 DIAGNOSIS — G8929 Other chronic pain: Secondary | ICD-10-CM

## 2022-03-09 DIAGNOSIS — M6281 Muscle weakness (generalized): Secondary | ICD-10-CM

## 2022-03-09 NOTE — Therapy (Signed)
?OUTPATIENT PHYSICAL THERAPY TREATMENT NOTE ? ? ?Patient Name: Tony Sharp ?MRN: [id number] ?DOB:[birth year], 26 y.o., adult ?Today's Date: 03/09/2022 ? ?PCP: Westley Chandler, MD ?REFERRING PROVIDER: Ralene Cork, DO ? ? PT End of Session - 03/09/22 1059   ? ? Visit Number 6   ? Number of Visits 16   ? Date for PT Re-Evaluation 04/13/22   ? Authorization Type [employer] FOTO 6th and 10th visit   ? PT Start Time 1100   ? PT Stop Time 1148   ? PT Time Calculation (min) 48 min   ? Activity Tolerance Patient tolerated treatment well   ? ?  ?  ? ?  ? ? ? ? ? ? ?Past Medical History:  ?Diagnosis Date  ? ADHD   ? Anxiety   ? Gender dysphoria   ? ?Past Surgical History:  ?Procedure Laterality Date  ? MASTECTOMY    ? ?Patient Active Problem List  ? Diagnosis Date Noted  ? Recurrent cystitis 02/16/2021  ? MDD (major depressive disorder) 02/16/2021  ? Vitamin B12 deficiency 08/25/2020  ? Insomnia 06/14/2020  ? 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 ? ?REFERRING DIAG: M25.562,G89.29 (ICD-10-CM) - Chronic pain of left knee ? ?THERAPY DIAG:  ?Chronic pain of left knee ? ?Muscle weakness (generalized) ? ?PERTINENT HISTORY: Mastectomy,male to male transgender. No issues remarkable affecting knee except obesity, Left knee valgus ? ?PRECAUTIONS: none ? ?SUBJECTIVE: "I am doing okay. Certain exercises I think are causing some  ? ?PAIN:  ?Are you having pain? Yes: NPRS scale: 2/10 ?Pain location: medial knee joint ?Pain description: aching sore ?Aggravating factors: walking/ standing ?Relieving factors: some exercise  ? ? ? ? ?OBJECTIVE:  ?**Measures assessed at evaluation, reassessment noted by date**  ? ?DIAGNOSTIC FINDINGS: IMPRESSION:01-13-22 ?1. Moderate patella alta. Left knee ?2. Mild medial compartment joint space narrowing. ?  ?PATIENT SURVEYS:  ?LEFS 44/80 ?FOTO 49%  predicted 70% ?03/09/2022 59% ?  ?COGNITION: ?         Overall cognitive status: Within functional limits for tasks assessed              ?           ?SENSATION: ?         Light touch: Appears intact ?         Stereognosis: Appears intact ?         Hot/Cold: Appears intact ?         Proprioception: Appears intact ?  ?MUSCLE LENGTH: ?Hamstrings: Right 55 deg; Left 56 deg ?Thomas test: Right 15 deg; Left 15 deg ?  ?POSTURE:  ?Increased lordotic curve, obesity, knee valgus ?Patella alta bil L>R ?  ?PALPATION: ?TTP medial joint line, and peri-patellar ?  ?LE AROM/PROM: ?  ?A/PROM Right ?02/16/2022 Left ?02/16/2022  ?Hip flexion 120 120  ?Hip extension      ?Hip abduction      ?Hip adduction      ?Hip internal rotation      ?Hip external rotation      ?Knee flexion 128/135 128/130  ?Knee extension 0 0  ?Ankle dorsiflexion 8 10  ?Ankle plantarflexion      ?Ankle inversion      ?Ankle eversion      ? (Blank rows = not tested) ?  ?LE MMT: ?  ?MMT Right ?02/16/2022 Left ?02/16/2022  ?Hip flexion 4+ 4+  ?Hip extension 4 4  ?Hip abduction 4 4-  ?Hip adduction      ?Hip internal rotation      ?  Hip external rotation      ?Knee flexion 4+ 4+  ?Knee extension 4+ 4  ?Ankle dorsiflexion      ?Ankle plantarflexion      ?Ankle inversion      ?Ankle eversion      ? (Blank rows = not tested) ?  ?LOWER EXTREMITY SPECIAL TESTS:  ?NT ?02/28/2022 ?R: 1.5 cm navicular drop ?L: 1.3 cm Navculare drop ? ?FUNCTIONAL TESTS:  ?5 times sit to stand: 7.61 sec  normal ?  ?GAIT: ?Distance walked: 200 ?Assistive device utilized: None ?Level of assistance: Complete Independence ?Comments: Pt able to negotiate steps with alternating step pattern but pt does describe Left knee pain with with ascending and descending steps ?  ?  ?  ?TODAY'S TREATMENT: ? ?[facility] Adult PT Treatment:                                                DATE: 03/09/2022 ?Therapeutic Exercise: ?PNF contract/ relax quad 2 x 30 sec  ?LAQ con bil eccentric LLE 2 x 10 ?Single leg press LLE 2 x 15 40#  ?Posterior tib strengthening LLE 2 x 20 with GTB ?Lateral band walks 2 x 15 ft with GTB figure x around LE ?Manual Therapy: ?DTM along the lateral  aspect of the quad ?Neuromuscular re-ed: ?Gait training heel strike / toe off 4 x 50 ft, cue sto avoid excessive lateral sway with push off.  ? ? ?[facility] Adult PT Treatment:                                                DATE: 03/02/2022 ?Therapeutic Exercise: ?SLR 2 x 20 with external rotation ?L hip abduction performing cirlce CW/CCW x 10 ea x 3 sets ?Posterior tib strengthening 1 x 20 bil with GTB ?L sidelying hip ER 2 x 20 with 4# ?Manual Therapy: ?Bil arch taping to reduce navicular drop ?Low load long duration inferior patellar glide ?MTPR along vastus lateralis  ? ? ?[facility] Adult PT Treatment:                                                DATE: 02/28/2022 ?Therapeutic Exercise: ?Bil hip abduction 3 x 15 in Sidelying  ?Hip ER 2 x 15 bil  ?Posterior tib strengthening bil with heel raise with ball squeeze between the ankles for posterior tib activation  ?LAQ with ball squeeze to facilitate VMO activation 2 x 15 ?Manual Therapy: ?Bil arch taping to reduce navicular drop ?MTPR along vastus lateralis  ?Neuro-red: ?Gait training focusing on keeping knee in alignment with the foot ?Standing with hands on the wall marching back / forth with focus on keeping knee in neutral position avoiing excessive med/ lateral movement 1 x 20 progressing to walking keeping that same biomechanics which the pt noted improvement in pain. Walking 4 x 50 ft ?  ?PATIENT EDUCATION:  ?Education details: POC Explanation of findings, Mc connell taping and FOTO report with handout, issued initial HEP ?Person educated: Patient ?Education method: Explanation and Demonstration ?Education comprehension: verbalized understanding and needs further education ?  ?  ?HOME EXERCISE  PROGRAM: ? ?Access Code: [email or web address] ?URL: [email or web address] ?Date: 03/09/2022 ?Prepared by: Lulu RidingKristoffer Constantino Starace ? ?Exercises ?- Bridge with Hamstring Curl on [employer]Swiss Ball  - 1 x daily - 7 x weekly - 3 sets - 10 reps ?- Sitting Knee Extension with Resistance  - 1 x  daily - 7 x weekly - 3 sets - 10 reps ?- sidelying hip abduction ankle sock against wall  - 1 x daily - 7 x weekly - 3 sets - 10 reps ?- Standing Terminal Knee Extension with Resistance  - 1 x daily - 7 x weekly - 3 sets - 10 reps ?- Spanish or Goblet squats  - 1 x daily - 7 x weekly - 3 sets - 10 reps ?- Single Leg Bridge  - 1 x daily - 7 x weekly - 3 sets - 10 reps ?- Hip Extension with Resistance Loop  - 1 x daily - 7 x weekly - 2 sets - 15 reps ?- Side Stepping with Resistance at Thighs  - 1 x daily - 7 x weekly - 3 sets - 15 reps ?- Sidelying Hip External Rotation on Table  - 1 x daily - 7 x weekly - 2 sets - 20 reps - 1 hold ?- Sit to Stand with Resistance Around Legs  - 1 x daily - 7 x weekly - 2 sets - 20 reps ?- Long Sitting Isometric Ankle Inversion in Plantar Flexion with Ball at Wall  - 1 x daily - 7 x weekly - 2 sets - 20 reps ?- Lateral Step Down  - 1 x daily - 7 x weekly - 2 sets - 10 reps ? ? ? ? ?  ?  ?ASSESSMENT: ?  ?CLINICAL IMPRESSION: ?Pt reports pain today at 2/10 and notes difficulty assessing benefit of the arch tape applied last session. Continued application  ? pt arrives to session reporting 2/10 and has been mostly consistent with his HEP with some difficulty doing multiple times a week. Reviewed STW and DTM that can be performed at home using a rolling pin, updated HEP today to continue working quad and posterior tib for distal control. He is making progress with function, modified exercise to avoid standing TKE due to report of increased pain. Pt notes he plans to get some inserts on Saturday, his gait has improved with reduction in knee valgus/ IR with mid stance to toe off.  ?  ?OBJECTIVE IMPAIRMENTS decreased activity tolerance, decreased ROM, decreased strength, increased fascial restrictions, obesity, and pain.  ?  ?ACTIVITY LIMITATIONS occupation.  ?  ?PERSONAL FACTORS obesity, Left knee valgus are also affecting patient's functional outcome.  ?  ?  ?REHAB POTENTIAL: Good ?   ?CLINICAL DECISION MAKING: Evolving/moderate complexity ?  ?EVALUATION COMPLEXITY: Moderate ?  ?  ?GOALS: ?Goals reviewed with patient? Yes ?  ?SHORT TERM GOALS: ?  ?STG Name Target Date Goal status  ?1 Pt will be i

## 2022-03-14 ENCOUNTER — Other Ambulatory Visit: Payer: Self-pay

## 2022-03-14 ENCOUNTER — Ambulatory Visit: Payer: BC Managed Care – PPO | Admitting: Physical Therapy

## 2022-03-14 DIAGNOSIS — G8929 Other chronic pain: Secondary | ICD-10-CM

## 2022-03-14 DIAGNOSIS — M25562 Pain in left knee: Secondary | ICD-10-CM | POA: Diagnosis not present

## 2022-03-14 DIAGNOSIS — M6281 Muscle weakness (generalized): Secondary | ICD-10-CM

## 2022-03-14 NOTE — Therapy (Signed)
?OUTPATIENT PHYSICAL THERAPY TREATMENT NOTE ? ? ?Patient Name: Tony PughSeth A Provencher ?MRN: [id number] ?DOB:02/26/1996, 26 y.o., adult ?Today's Date: 03/14/2022 ? ?PCP: Westley ChandlerBrown, Carina M, MD ?REFERRING PROVIDER: Westley ChandlerBrown, Carina M, MD ? ? PT End of Session - 03/14/22 1328   ? ? Visit Number 7   ? Number of Visits 16   ? Date for PT Re-Evaluation 04/13/22   ? Authorization Type BCBS FOTO  10th visit   ? PT Start Time 1330   ? PT Stop Time 1415   ? PT Time Calculation (min) 45 min   ? Activity Tolerance Patient tolerated treatment well   ? [facility] tasks assessed/performed   ? ?  ?  ? ?  ? ? ? ? ? ? ? ?Past Medical History:  ?Diagnosis Date  ? ADHD   ? Anxiety   ? Gender dysphoria   ? ?Past Surgical History:  ?Procedure Laterality Date  ? MASTECTOMY    ? ?Patient Active Problem List  ? Diagnosis Date Noted  ? Recurrent cystitis 02/16/2021  ? MDD (major depressive disorder) 02/16/2021  ? Vitamin B12 deficiency 08/25/2020  ? Insomnia 06/14/2020  ? 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 ? ?REFERRING DIAG: M25.562,G89.29 (ICD-10-CM) - Chronic pain of left knee ? ?THERAPY DIAG:  ?Chronic pain of left knee ? ?Muscle weakness (generalized) ? ?PERTINENT HISTORY: Mastectomy,male to male transgender. No issues remarkable affecting knee except obesity, Left knee valgus ? ?PRECAUTIONS: none ? ?SUBJECTIVE: " I was able to get my inserts on Friday, and I am gradually increasing the time I have been wearing them. My R knee has been feeling some soreness." ? ?PAIN:  ?Are you having pain? Yes: NPRS scale: 0/10 ?Pain location: R knee pain 2/10 , L knee 1/10 ?Pain description: ache ?Aggravating factors: walking , bending the knee ?Relieving factors: sitting, resting ? ? ? ? ?OBJECTIVE:  ?**Measures assessed at evaluation, reassessment noted by date**  ? ?DIAGNOSTIC FINDINGS: IMPRESSION:01-13-22 ?1. Moderate patella alta. Left knee ?2. Mild medial compartment joint space narrowing. ?  ?PATIENT SURVEYS:  ?LEFS 44/80 ?FOTO  49%  predicted 70% ?03/09/2022 59% ?  ?COGNITION: ?         Overall cognitive status: Within functional limits for tasks assessed              ?          ?SENSATION: ?         Light touch: Appears intact ?         Stereognosis: Appears intact ?         Hot/Cold: Appears intact ?         Proprioception: Appears intact ?  ?MUSCLE LENGTH: ?Hamstrings: Right 55 deg; Left 56 deg ?Thomas test: Right 15 deg; Left 15 deg ?  ?POSTURE:  ?Increased lordotic curve, obesity, knee valgus ?Patella alta bil L>R ?  ?PALPATION: ?TTP medial joint line, and peri-patellar ?  ?LE AROM/PROM: ?  ?A/PROM Right ?02/16/2022 Left ?02/16/2022  ?Hip flexion 120 120  ?Hip extension      ?Hip abduction      ?Hip adduction      ?Hip internal rotation      ?Hip external rotation      ?Knee flexion 128/135 128/130  ?Knee extension 0 0  ?Ankle dorsiflexion 8 10  ?Ankle plantarflexion      ?Ankle inversion      ?Ankle eversion      ? (Blank rows = not tested) ?  ?LE  MMT: ?  ?MMT Right ?02/16/2022 Left ?02/16/2022  ?Hip flexion 4+ 4+  ?Hip extension 4 4  ?Hip abduction 4 4-  ?Hip adduction      ?Hip internal rotation      ?Hip external rotation      ?Knee flexion 4+ 4+  ?Knee extension 4+ 4  ?Ankle dorsiflexion      ?Ankle plantarflexion      ?Ankle inversion      ?Ankle eversion      ? (Blank rows = not tested) ?  ?LOWER EXTREMITY SPECIAL TESTS:  ?NT ?02/28/2022 ?R: 1.5 cm navicular drop ?L: 1.3 cm Navculare drop ? ?FUNCTIONAL TESTS:  ?5 times sit to stand: 7.61 sec  normal ?  ?GAIT: ?Distance walked: 200 ?Assistive device utilized: None ?Level of assistance: Complete Independence ?Comments: Pt able to negotiate steps with alternating step pattern but pt does describe Left knee pain with with ascending and descending steps ?  ?  ?  ?TODAY'S TREATMENT: ? ?[facility] Adult PT Treatment:                                                DATE: 03/14/2022 ?Therapeutic Exercise: ?Elliptical l5 x 5 min Ramp L5 - pt noted some soreness in the L knee which upon verbal cue to  rotate L foot in slightly it clamed down. ?Leg press 2 x 15 40#  single LE, bil ?Standing hip abduction 2 x 15 bil 20 # ?Kettlbell dead lift with 10# 1 x 10, demonstration for proper form, modified to going within 2 inches of the floor due to soreness noted in the medial aspect of bil knees . ?Manual Therapy: ?MTPR along the R rectus femoris ?IASTM along the R quad tendon ?DTM along the R quad  ? ? ?[facility] Adult PT Treatment:                                                DATE: 03/09/2022 ?Therapeutic Exercise: ?PNF contract/ relax quad 2 x 30 sec  ?LAQ con bil eccentric LLE 2 x 10 ?Single leg press LLE 2 x 15 40#  ?Posterior tib strengthening LLE 2 x 20 with GTB ?Lateral band walks 2 x 15 ft with GTB figure x around LE ?Manual Therapy: ?DTM along the lateral aspect of the quad ?Neuromuscular re-ed: ?Gait training heel strike / toe off 4 x 50 ft, cue sto avoid excessive lateral sway with push off.  ? ? ?[facility] Adult PT Treatment:                                                DATE: 03/02/2022 ?Therapeutic Exercise: ?SLR 2 x 20 with external rotation ?L hip abduction performing cirlce CW/CCW x 10 ea x 3 sets ?Posterior tib strengthening 1 x 20 bil with GTB ?L sidelying hip ER 2 x 20 with 4# ?Manual Therapy: ?Bil arch taping to reduce navicular drop ?Low load long duration inferior patellar glide ?MTPR along vastus lateralis  ? ?  ?PATIENT EDUCATION:  ?Education details: POC Explanation of findings, Mc connell taping and FOTO report with handout,  issued initial HEP ?Person educated: Patient ?Education method: Explanation and Demonstration ?Education comprehension: verbalized understanding and needs further education ?  ?  ?HOME EXERCISE PROGRAM: ? ?Access Code: [email or web address] ?URL: [email or web address] ?Date: 03/09/2022 ?Prepared by: Lulu Riding ? ?Exercises ?- Bridge with Hamstring Curl on [employer]  - 1 x daily - 7 x weekly - 3 sets - 10 reps ?- Sitting Knee Extension with Resistance  - 1 x daily - 7 x  weekly - 3 sets - 10 reps ?- sidelying hip abduction ankle sock against wall  - 1 x daily - 7 x weekly - 3 sets - 10 reps ?- Standing Terminal Knee Extension with Resistance  - 1 x daily - 7 x weekly - 3 sets - 10 reps ?- Spanish or Goblet squats  - 1 x daily - 7 x weekly - 3 sets - 10 reps ?- Single Leg Bridge  - 1 x daily - 7 x weekly - 3 sets - 10 reps ?- Hip Extension with Resistance Loop  - 1 x daily - 7 x weekly - 2 sets - 15 reps ?- Side Stepping with Resistance at Thighs  - 1 x daily - 7 x weekly - 3 sets - 15 reps ?- Sidelying Hip External Rotation on Table  - 1 x daily - 7 x weekly - 2 sets - 20 reps - 1 hold ?- Sit to Stand with Resistance Around Legs  - 1 x daily - 7 x weekly - 2 sets - 20 reps ?- Long Sitting Isometric Ankle Inversion in Plantar Flexion with Ball at Wall  - 1 x daily - 7 x weekly - 2 sets - 20 reps ?- Lateral Step Down  - 1 x daily - 7 x weekly - 2 sets - 10 reps ? ? ? ? ?  ?  ?ASSESSMENT: ?  ?CLINICAL IMPRESSION: ? ?Pt reports getting inserts for his shoes and notes he is graduating his use to help prevent soreness. He notes improvement in the L knee pain today with mild soreness noted in the  Rknee. Worked on STW along the R quad/ quad tendon. Continued working on hip strengthening which he required min cues for foot/ knee position to maximize patellar tracking. He did well with all exerciseses noting some soreness end of session which is likely from doing the exercise today.  ? pt plan to set up a return visit with his MD in the next few weeks to assess how he is doing.  ?  ?OBJECTIVE IMPAIRMENTS decreased activity tolerance, decreased ROM, decreased strength, increased fascial restrictions, obesity, and pain.  ?  ?ACTIVITY LIMITATIONS occupation.  ?  ?PERSONAL FACTORS obesity, Left knee valgus are also affecting patient's functional outcome.  ?  ?  ?REHAB POTENTIAL: Good ?  ?CLINICAL DECISION MAKING: Evolving/moderate complexity ?  ?EVALUATION COMPLEXITY: Moderate ?  ?   ?GOALS: ?Goals reviewed with patient? Yes ?  ?SHORT TERM GOALS: ?  ?STG Name Target Date Goal status  ?1 Pt will be independent with initial HEP ?Baseline: no knowledge issued 02-21-22 pt pain 0/10 03/09/2022 INITIAL  ?2 Pt

## 2022-03-16 ENCOUNTER — Encounter: Payer: Self-pay | Admitting: Physical Therapy

## 2022-03-16 ENCOUNTER — Ambulatory Visit: Payer: BC Managed Care – PPO | Admitting: Physical Therapy

## 2022-03-16 DIAGNOSIS — G8929 Other chronic pain: Secondary | ICD-10-CM

## 2022-03-16 DIAGNOSIS — M25562 Pain in left knee: Secondary | ICD-10-CM | POA: Diagnosis not present

## 2022-03-16 DIAGNOSIS — M6281 Muscle weakness (generalized): Secondary | ICD-10-CM

## 2022-03-16 NOTE — Therapy (Addendum)
?OUTPATIENT PHYSICAL THERAPY TREATMENT NOTE ? ? ?Patient Name: Tony Sharp ?MRN: [id number] ?DOB:1996/01/13, 26 y.o., adult ?Today's Date: 03/16/2022 ? ?PCP: Martyn Malay, MD ?REFERRING PROVIDER: Martyn Malay, MD ? ? PT End of Session - 03/16/22 1107   ? ? Visit Number 8   ? Number of Visits 16   ? Date for PT Re-Evaluation 04/13/22   ? Authorization Type [employer]BS FOTO  10th visit   ? PT Start Time 1104   ? PT Stop Time 1148   ? PT Time Calculation (min) 44 min   ? Activity Tolerance Patient tolerated treatment well   ? Behavior During Therapy [facility] for tasks assessed/performed   ? ?  ?  ? ?  ? ? ? ? ? ? ? ? ?Past Medical History:  ?Diagnosis Date  ? ADHD   ? Anxiety   ? Gender dysphoria   ? ?Past Surgical History:  ?Procedure Laterality Date  ? MASTECTOMY    ? ?Patient Active Problem List  ? Diagnosis Date Noted  ? Recurrent cystitis 02/16/2021  ? MDD (major depressive disorder) 02/16/2021  ? Vitamin B12 deficiency 08/25/2020  ? Insomnia 06/14/2020  ? 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 ? ?REFERRING DIAG: M25.562,G89.29 (ICD-10-CM) - Chronic pain of left knee ? ?THERAPY DIAG:  ?Chronic pain of left knee ? ?Muscle weakness (generalized) ? ?PERTINENT HISTORY: Mastectomy,male to male transgender. No issues remarkable affecting knee except obesity, Left knee valgus ? ?PRECAUTIONS: none ? ?SUBJECTIVE: " I am doing pretty good, some soreness still in the R knee." ? ?PAIN:  ?Are you having pain? Yes: NPRS scale: 0/10 ?Pain location: R knee pain 2/10 , L knee 1/10 ?Pain description: ache ?Aggravating factors: walking , bending the knee ?Relieving factors: sitting, resting ? ? ? ? ?OBJECTIVE:  ?**Measures assessed at evaluation, reassessment noted by date**  ? ?DIAGNOSTIC FINDINGS: IMPRESSION:01-13-22 ?1. Moderate patella alta. Left knee ?2. Mild medial compartment joint space narrowing. ?  ?PATIENT SURVEYS:  ?LEFS 44/80 ?FOTO 49%  predicted 70% ?03/09/2022 59% ?  ?COGNITION: ?         Overall cognitive status:  Within functional limits for tasks assessed              ?          ?SENSATION: ?         Light touch: Appears intact ?         Stereognosis: Appears intact ?         Hot/Cold: Appears intact ?         Proprioception: Appears intact ?  ?MUSCLE LENGTH: ?Hamstrings: Right 55 deg; Left 56 deg ?Thomas test: Right 15 deg; Left 15 deg ?  ?POSTURE:  ?Increased lordotic curve, obesity, knee valgus ?Patella alta bil L>R ?  ?PALPATION: ?TTP medial joint line, and peri-patellar ?  ?LE AROM/PROM: ?  ?A/PROM Right ?02/16/2022 Left ?02/16/2022  ?Hip flexion 120 120  ?Hip extension      ?Hip abduction      ?Hip adduction      ?Hip internal rotation      ?Hip external rotation      ?Knee flexion 128/135 128/130  ?Knee extension 0 0  ?Ankle dorsiflexion 8 10  ?Ankle plantarflexion      ?Ankle inversion      ?Ankle eversion      ? (Blank rows = not tested) ?  ?LE MMT: ?  ?MMT Right ?02/16/2022 Left ?02/16/2022  ?Hip flexion 4+ 4+  ?Hip extension  4 4  ?Hip abduction 4 4-  ?Hip adduction      ?Hip internal rotation      ?Hip external rotation      ?Knee flexion 4+ 4+  ?Knee extension 4+ 4  ?Ankle dorsiflexion      ?Ankle plantarflexion      ?Ankle inversion      ?Ankle eversion      ? (Blank rows = not tested) ?  ?LOWER EXTREMITY SPECIAL TESTS:  ?NT ?02/28/2022 ?R: 1.5 cm navicular drop ?L: 1.3 cm Navculare drop ? ?FUNCTIONAL TESTS:  ?5 times sit to stand: 7.61 sec  normal ?  ?GAIT: ?Distance walked: 200 ?Assistive device utilized: None ?Level of assistance: Complete Independence ?Comments: Pt able to negotiate steps with alternating step pattern but pt does describe Left knee pain with with ascending and descending steps ?  ?  ?  ?TODAY'S TREATMENT: ?[facility] Adult PT Treatment:                                                DATE: 03/16/2022 ?Therapeutic Exercise: ?Recumbent bike L5 x 5 min  ?Standing calf stretch 2 x 30 sec on slant board ?Standing quad stretch 1 x 30 sec standing ?Dead lift using small weighte 8# box, 2 x 15 ?Neuromuscular  re-ed: ?Balance with rebounder, rhomberg 1 x 15 with Red ball, modified tandeme 1 x 15 x 2 switching lead leg with red ball, progressed to full tandem 2 x 15 chanigng lead leg. Single leg balance 1 x 15 ea leg ?Repeated on airex pad ? ? ?[facility] Adult PT Treatment:                                                DATE: 03/14/2022 ?Therapeutic Exercise: ?Elliptical l5 x 5 min Ramp L5 - pt noted some soreness in the L knee which upon verbal cue to rotate L foot in slightly it clamed down. ?Leg press 2 x 15 40#  single LE, bil ?Standing hip abduction 2 x 15 bil 20 # ?Kettlbell dead lift with 10# 1 x 10, demonstration for proper form, modified to going within 2 inches of the floor due to soreness noted in the medial aspect of bil knees . ?Manual Therapy: ?MTPR along the R rectus femoris ?IASTM along the R quad tendon ?DTM along the R quad  ? ? ?[facility] Adult PT Treatment:                                                DATE: 03/09/2022 ?Therapeutic Exercise: ?PNF contract/ relax quad 2 x 30 sec  ?LAQ con bil eccentric LLE 2 x 10 ?Single leg press LLE 2 x 15 40#  ?Posterior tib strengthening LLE 2 x 20 with GTB ?Lateral band walks 2 x 15 ft with GTB figure x around LE ?Manual Therapy: ?DTM along the lateral aspect of the quad ?Neuromuscular re-ed: ?Gait training heel strike / toe off 4 x 50 ft, cue sto avoid excessive lateral sway with push off.  ?  ?PATIENT EDUCATION:  ?Education details: POC Explanation of findings, Mc connell  taping and FOTO report with handout, issued initial HEP ?Person educated: Patient ?Education method: Explanation and Demonstration ?Education comprehension: verbalized understanding and needs further education ?  ?  ?HOME EXERCISE PROGRAM: ? ?Access Code: [email or web address] ?URL: [email or web address] ?Date: 03/09/2022 ?Prepared by: Starr Lake ? ?Exercises ?- Bridge with Hamstring Curl on [employer]  - 1 x daily - 7 x weekly - 3 sets - 10 reps ?- Sitting Knee Extension with Resistance  - 1 x  daily - 7 x weekly - 3 sets - 10 reps ?- sidelying hip abduction ankle sock against wall  - 1 x daily - 7 x weekly - 3 sets - 10 reps ?- Standing Terminal Knee Extension with Resistance  - 1 x daily - 7 x weekly - 3 sets - 10 reps ?- Spanish or Goblet squats  - 1 x daily - 7 x weekly - 3 sets - 10 reps ?- Single Leg Bridge  - 1 x daily - 7 x weekly - 3 sets - 10 reps ?- Hip Extension with Resistance Loop  - 1 x daily - 7 x weekly - 2 sets - 15 reps ?- Side Stepping with Resistance at Thighs  - 1 x daily - 7 x weekly - 3 sets - 15 reps ?- Sidelying Hip External Rotation on Table  - 1 x daily - 7 x weekly - 2 sets - 20 reps - 1 hold ?- Sit to Stand with Resistance Around Legs  - 1 x daily - 7 x weekly - 2 sets - 20 reps ?- Long Sitting Isometric Ankle Inversion in Plantar Flexion with Ball at [place]  - 1 x daily - 7 x weekly - 2 sets - 20 reps ?- Lateral Step Down  - 1 x daily - 7 x weekly - 2 sets - 10 reps ? ? ? ? ?  ?  ?ASSESSMENT: ?  ?CLINICAL IMPRESSION: ?Joseff arrives to appointment noting continued soreness in the R knee but overall is still doing better. Focused today's session primarily on balance with varying BOS on both stable and unstable surfaces. Which he demonstrated postural sway sway and intermittent toe touch but was ultimately did quite well. Continued workin gon graded loading of the LE with dead lifting which he completed but did fatigue quickly with. He sees the MD in the next week. ? ?OBJECTIVE IMPAIRMENTS decreased activity tolerance, decreased ROM, decreased strength, increased fascial restrictions, obesity, and pain.  ?  ?ACTIVITY LIMITATIONS occupation.  ?  ?PERSONAL FACTORS obesity, Left knee valgus are also affecting patient's functional outcome.  ?  ?  ?REHAB POTENTIAL: Good ?  ?CLINICAL DECISION MAKING: Evolving/moderate complexity ?  ?EVALUATION COMPLEXITY: Moderate ?  ?  ?GOALS: ?Goals reviewed with patient? Yes ?  ?SHORT TERM GOALS: ?  ?STG Name Target Date Goal status  ?1 Pt will be  independent with initial HEP ?Baseline: no knowledge issued 02-21-22 pt pain 0/10 03/09/2022 Met ?03/16/2022  ?2 Pt will decreased pain with stair climbing to 3/10 or less ?Baseline: 7/10 03/09/2022 Partially met ?3/30/

## 2022-03-22 NOTE — Therapy (Signed)
?OUTPATIENT PHYSICAL THERAPY TREATMENT NOTE ? ? ?Patient Name: Tony PughSeth A Jent ?MRN: [id number] ?DOB:01/03/1996, 26 y.o., adult ?Today's Date: 03/23/2022 ? ?PCP: Westley ChandlerBrown, Carina M, MD ?REFERRING PROVIDER: Ralene Corkraper, Timothy R, DO ? ? PT End of Session - 03/23/22 1109   ? ? Visit Number 9   ? Number of Visits 16   ? Date for PT Re-Evaluation 04/13/22   ? Authorization Type [employer] FOTO  10th visit   ? PT Start Time 1106   ? PT Stop Time 1144   ? PT Time Calculation (min) 38 min   ? ?  ?  ? ?  ? ? ? ? ? ? ? ? ? ?Past Medical History:  ?Diagnosis Date  ? ADHD   ? Anxiety   ? Gender dysphoria   ? ?Past Surgical History:  ?Procedure Laterality Date  ? MASTECTOMY    ? ?Patient Active Problem List  ? Diagnosis Date Noted  ? Recurrent cystitis 02/16/2021  ? MDD (major depressive disorder) 02/16/2021  ? Vitamin B12 deficiency 08/25/2020  ? Insomnia 06/14/2020  ? 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 ? ?REFERRING DIAG: M25.562,G89.29 (ICD-10-CM) - Chronic pain of left knee ? ?THERAPY DIAG:  ?Chronic pain of left knee ? ?Muscle weakness (generalized) ? ?PERTINENT HISTORY: Mastectomy,male to male transgender. No issues remarkable affecting knee except obesity, Left knee valgus ? ?PRECAUTIONS: none ? ?SUBJECTIVE: "I am having more pain in the original area that has been bothering me. I haven't had much time to do my exercises lately and I jumped back in and started doing it again and I woke up Tuesday morning in pain." ? ?PAIN:  ?Are you having pain? Yes: NPRS scale: 5/10 ?Pain location: R knee pain 2/10 , L knee 1/10 ?Pain description: ache ?Aggravating factors: walking , bending the knee ?Relieving factors: sitting, resting ? ? ? ? ?OBJECTIVE:  ?**Measures assessed at evaluation, reassessment noted by date**  ? ?DIAGNOSTIC FINDINGS: IMPRESSION:01-13-22 ?1. Moderate patella alta. Left knee ?2. Mild medial compartment joint space narrowing. ?  ?PATIENT SURVEYS:  ?LEFS 44/80 ?FOTO 49%  predicted 70% ?03/09/2022 59% ?  ?COGNITION: ?          Overall cognitive status: Within functional limits for tasks assessed              ?          ?SENSATION: ?         Light touch: Appears intact ?         Stereognosis: Appears intact ?         Hot/Cold: Appears intact ?         Proprioception: Appears intact ?  ?MUSCLE LENGTH: ?Hamstrings: Right 55 deg; Left 56 deg ?Thomas test: Right 15 deg; Left 15 deg ?  ?POSTURE:  ?Increased lordotic curve, obesity, knee valgus ?Patella alta bil L>R ?  ?PALPATION: ?TTP medial joint line, and peri-patellar ?  ?LE AROM/PROM: ?  ?A/PROM Right ?02/16/2022 Left ?02/16/2022  ?Hip flexion 120 120  ?Hip extension      ?Hip abduction      ?Hip adduction      ?Hip internal rotation      ?Hip external rotation      ?Knee flexion 128/135 128/130  ?Knee extension 0 0  ?Ankle dorsiflexion 8 10  ?Ankle plantarflexion      ?Ankle inversion      ?Ankle eversion      ? (Blank rows = not tested) ?  ?LE MMT: ?  ?MMT Right ?  02/16/2022 Left ?02/16/2022  ?Hip flexion 4+ 4+  ?Hip extension 4 4  ?Hip abduction 4 4-  ?Hip adduction      ?Hip internal rotation      ?Hip external rotation      ?Knee flexion 4+ 4+  ?Knee extension 4+ 4  ?Ankle dorsiflexion      ?Ankle plantarflexion      ?Ankle inversion      ?Ankle eversion      ? (Blank rows = not tested) ?  ?LOWER EXTREMITY SPECIAL TESTS:  ?NT ?02/28/2022 ?R: 1.5 cm navicular drop ?L: 1.3 cm Navculare drop ? ?FUNCTIONAL TESTS:  ?5 times sit to stand: 7.61 sec  normal ?  ?GAIT: ?Distance walked: 200 ?Assistive device utilized: None ?Level of assistance: Complete Independence ?Comments: Pt able to negotiate steps with alternating step pattern but pt does describe Left knee pain with with ascending and descending steps ?  ?  ?  ?TODAY'S TREATMENT: ? ?[facility] Adult PT Treatment:                                                DATE: 03/23/2022 ?Therapeutic Exercise: ?Quad stretch 3x 30 sec  ?L Glute med stretch 2 x 30 sec in R sidelying ?L abduction in R sidelying 2 x 15 ?Seated hamstring stretch 2 x 30 sec ?Manual  Therapy: ?MTPR along vastus lateralis, rectus femoris, glute med, semi-teindionsus/ membranosus ?Neuromuscular re-ed: ?Corner balance: Rhomberg 2 x 30 sec, Tandem 1 x 30 sec ea with, SLS 1x 30 sec ? ? ?[facility] Adult PT Treatment:                                                DATE: 03/16/2022 ?Therapeutic Exercise: ?Recumbent bike L5 x 5 min  ?Standing calf stretch 2 x 30 sec on slant board ?Standing quad stretch 1 x 30 sec standing ?Dead lift using small weighte 8# box, 2 x 15 ?Neuromuscular re-ed: ?Balance with rebounder, rhomberg 1 x 15 with Red ball, modified tandeme 1 x 15 x 2 switching lead leg with red ball, progressed to full tandem 2 x 15 chanigng lead leg. Single leg balance 1 x 15 ea leg ?Repeated on airex pad ? ? ?[facility] Adult PT Treatment:                                                DATE: 03/14/2022 ?Therapeutic Exercise: ?Elliptical l5 x 5 min Ramp L5 - pt noted some soreness in the L knee which upon verbal cue to rotate L foot in slightly it clamed down. ?Leg press 2 x 15 40#  single LE, bil ?Standing hip abduction 2 x 15 bil 20 # ?Kettlbell dead lift with 10# 1 x 10, demonstration for proper form, modified to going within 2 inches of the floor due to soreness noted in the medial aspect of bil knees . ?Manual Therapy: ?MTPR along the R rectus femoris ?IASTM along the R quad tendon ?DTM along the R quad  ?  ?PATIENT EDUCATION:  ?Education details: POC Explanation of findings, Mc connell taping and FOTO report with  handout, issued initial HEP ?Person educated: Patient ?Education method: Explanation and Demonstration ?Education comprehension: verbalized understanding and needs further education ?  ?  ?HOME EXERCISE PROGRAM: ? ?Access Code: [email or web address] ?URL: [email or web address] ?Date: 03/09/2022 ?Prepared by: Lulu Riding ? ?Exercises ?- Bridge with Hamstring Curl on [employer]  - 1 x daily - 7 x weekly - 3 sets - 10 reps ?- Sitting Knee Extension with Resistance  - 1 x daily - 7 x weekly -  3 sets - 10 reps ?- sidelying hip abduction ankle sock against wall  - 1 x daily - 7 x weekly - 3 sets - 10 reps ?- Standing Terminal Knee Extension with Resistance  - 1 x daily - 7 x weekly - 3 sets - 10 reps ?- Spanish or Goblet squats  - 1 x daily - 7 x weekly - 3 sets - 10 reps ?- Single Leg Bridge  - 1 x daily - 7 x weekly - 3 sets - 10 reps ?- Hip Extension with Resistance Loop  - 1 x daily - 7 x weekly - 2 sets - 15 reps ?- Side Stepping with Resistance at Thighs  - 1 x daily - 7 x weekly - 3 sets - 15 reps ?- Sidelying Hip External Rotation on Table  - 1 x daily - 7 x weekly - 2 sets - 20 reps - 1 hold ?- Sit to Stand with Resistance Around Legs  - 1 x daily - 7 x weekly - 2 sets - 20 reps ?- Long Sitting Isometric Ankle Inversion in Plantar Flexion with Ball at Wall  - 1 x daily - 7 x weekly - 2 sets - 20 reps ?- Lateral Step Down  - 1 x daily - 7 x weekly - 2 sets - 10 reps ? ? ? ? ?  ?  ?ASSESSMENT: ?  ?CLINICAL IMPRESSION: ?Pt reports having increased L knee pain today that started earlier in the week. Focused on STW techniques and stretching to calm down soreness. Continued working on hip strengthening abductor strengthening to promote knee stability. Practiced balance training in the corner to promote confidence / safety but also provid ea way to count how he is progressing by counting wall/ chair taps. During balance training he reported that he thinks he may have bothered his knee with balance by using his knee to help maintain balance and push his knee further into a valgus position. He sees the Md before next session, plan to see how he is doing following that visit.  ? ?OBJECTIVE IMPAIRMENTS decreased activity tolerance, decreased ROM, decreased strength, increased fascial restrictions, obesity, and pain.  ?  ?ACTIVITY LIMITATIONS occupation.  ?  ?PERSONAL FACTORS obesity, Left knee valgus are also affecting patient's functional outcome.  ?  ?  ?REHAB POTENTIAL: Good ?  ?CLINICAL DECISION MAKING:  Evolving/moderate complexity ?  ?EVALUATION COMPLEXITY: Moderate ?  ?  ?GOALS: ?Goals reviewed with patient? Yes ?  ?SHORT TERM GOALS: ?  ?STG Name Target Date Goal status  ?1 Pt will be independent wi

## 2022-03-23 ENCOUNTER — Encounter: Payer: Self-pay | Admitting: Physical Therapy

## 2022-03-23 ENCOUNTER — Ambulatory Visit: Payer: BC Managed Care – PPO | Attending: Sports Medicine | Admitting: Physical Therapy

## 2022-03-23 DIAGNOSIS — M25562 Pain in left knee: Secondary | ICD-10-CM | POA: Diagnosis present

## 2022-03-23 DIAGNOSIS — M6281 Muscle weakness (generalized): Secondary | ICD-10-CM | POA: Diagnosis present

## 2022-03-23 DIAGNOSIS — G8929 Other chronic pain: Secondary | ICD-10-CM | POA: Diagnosis present

## 2022-03-28 ENCOUNTER — Ambulatory Visit: Payer: BC Managed Care – PPO | Admitting: Physical Therapy

## 2022-03-30 ENCOUNTER — Ambulatory Visit: Payer: BC Managed Care – PPO | Admitting: Sports Medicine

## 2022-03-30 ENCOUNTER — Encounter: Payer: BC Managed Care – PPO | Admitting: Physical Therapy

## 2022-03-30 VITALS — BP 124/82 | Ht 67.0 in | Wt 250.0 lb

## 2022-03-30 DIAGNOSIS — M25562 Pain in left knee: Secondary | ICD-10-CM

## 2022-03-30 DIAGNOSIS — G8929 Other chronic pain: Secondary | ICD-10-CM | POA: Diagnosis not present

## 2022-03-31 NOTE — Progress Notes (Signed)
? ?  Subjective:  ? ? Patient ID: Tony Sharp, adult    DOB: [birth year], 26 y.o.   MRN: [id number] ? ?HPI ? ?Tony Sharp presents today for follow-up on left knee pain.  He was last seen in the office on January 31.  X-rays at that time showed patella alta and some mild medial joint space narrowing.  He continues to have medial sided pain despite extensive physical therapy.  He continues to endorse intermittent swelling.  No recent subluxations. ? ? ?Review of Systems ?As above ?   ?Objective:  ? Physical Exam ? ?Well-developed, well-nourished no acute distress ? ?Left knee: Full range of motion.  Trace effusion.  He continues to be tender to palpation along the medial joint line with a positive Thessaly's.  No tenderness along the lateral joint line.  Slight valgus with standing.  Pseudolaxity of the medial collateral ligament.  Knee remains stable to anterior posterior drawer testing.  Negative patellar apprehension.  Neurovascularly intact distally. ? ? ?   ?Assessment & Plan:  ? ?Chronic knee pain worrisome for medial meniscal tear ? ?Given the chronicity of symptoms and failure to improve with several weeks of physical therapy, I would like to pursue an MRI scan of the knee specifically to rule out a medial meniscal tear.  Phone follow-up with those results at which time we will delineate further treatment.  In the meantime, Tony Sharp would like to continue with physical therapy. ? ?This note was dictated using Dragon naturally speaking software and may contain errors in syntax, spelling, or content which have not been identified prior to signing this note.  ?

## 2022-04-04 ENCOUNTER — Ambulatory Visit: Payer: BC Managed Care – PPO | Admitting: Physical Therapy

## 2022-04-04 ENCOUNTER — Encounter: Payer: Self-pay | Admitting: Physical Therapy

## 2022-04-04 DIAGNOSIS — G8929 Other chronic pain: Secondary | ICD-10-CM

## 2022-04-04 DIAGNOSIS — M6281 Muscle weakness (generalized): Secondary | ICD-10-CM

## 2022-04-04 DIAGNOSIS — M25562 Pain in left knee: Secondary | ICD-10-CM | POA: Diagnosis not present

## 2022-04-04 NOTE — Therapy (Addendum)
OUTPATIENT PHYSICAL THERAPY TREATMENT NOTE / Discharge   Patient Name: Tony Sharp MRN: [id number] DOB:[birth year], 26 y.o., adult Today's Date: 04/04/2022  PCP: Martyn Malay, MD REFERRING PROVIDER: Martyn Malay, MD   PT End of Session - 04/04/22 1331     Visit Number 10    Number of Visits 16    Date for PT Re-Evaluation 04/13/22    Authorization Type [employer] FOTO  10th visit    PT Start Time 1330    PT Stop Time 1415    PT Time Calculation (min) 45 min    Activity Tolerance Patient tolerated treatment well    Behavior During Therapy [facility] for tasks assessed/performed                     Past Medical History:  Diagnosis Date   ADHD    Anxiety    Gender dysphoria    Past Surgical History:  Procedure Laterality Date   MASTECTOMY     Patient Active Problem List   Diagnosis Date Noted   Recurrent cystitis 02/16/2021   MDD (major depressive disorder) 02/16/2021   Vitamin B12 deficiency 08/25/2020   Insomnia 06/14/2020   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 No issues remarkable affecting knee except obesity, Left knee valgus  PRECAUTIONS: none  SUBJECTIVE:"The knee finally started to stop hurting alittle, then I was sitting on a beam at school and with reaching I noticed my knee cracked and I felt, the MD reports he wants me to get an MRI."  PAIN:  Are you having pain? Yes: NPRS scale: 3/10 Pain location: L knee Pain description: ache Aggravating factors: walking , bending the knee Relieving factors: sitting, resting     OBJECTIVE:  **Measures assessed at evaluation, reassessment noted by date**   DIAGNOSTIC FINDINGS: IMPRESSION:01-13-22 1. Moderate patella alta. Left knee 2. Mild medial compartment joint space narrowing.    PATIENT SURVEYS:  LEFS 44/80 FOTO 49%  predicted 70% 03/09/2022 59% 04/04/2022 57%   COGNITION:          Overall cognitive status: Within functional limits for tasks assessed                        SENSATION:          Light touch: Appears intact          Stereognosis: Appears intact          Hot/Cold: Appears intact          Proprioception: Appears intact   MUSCLE LENGTH: Hamstrings: Right 55 deg; Left 56 deg Thomas test: Right 15 deg; Left 15 deg   POSTURE:  Increased lordotic curve, obesity, knee valgus Patella alta bil L>R   PALPATION: TTP medial joint line, and peri-patellar   LE AROM/PROM:   A/PROM Right 02/16/2022 Left 02/16/2022 Left 04/04/2022  Hip flexion 120 120   Hip extension       Hip abduction       Hip adduction       Hip internal rotation       Hip external rotation       Knee flexion 128/135 128/130   Knee extension 0 0   Ankle dorsiflexion 8 10   Ankle plantarflexion       Ankle inversion  Ankle eversion        (Blank rows = not tested)   LE MMT:   MMT Right 02/16/2022 Left 02/16/2022 Right 04/04/2022 Left 04/04/2022  Hip flexion 4+ 4+    Hip extension 4 4    Hip abduction 4 4-    Hip adduction        Hip internal rotation        Hip external rotation        Knee flexion 4+ 4+    Knee extension 4+ 4    Ankle dorsiflexion        Ankle plantarflexion        Ankle inversion        Ankle eversion         (Blank rows = not tested)   LOWER EXTREMITY SPECIAL TESTS:  NT 02/28/2022 R: 1.5 cm navicular drop L: 1.3 cm Navculare drop  FUNCTIONAL TESTS:  5 times sit to stand: 7.61 sec  normal   GAIT: Distance walked: 200 Assistive device utilized: None Level of assistance: Complete Independence Comments: Pt able to negotiate steps with alternating step pattern but pt does describe Left knee pain with with ascending and descending steps       TODAY'S TREATMENT: [facility] Adult PT Treatment:                                                 DATE: 04/04/2022 Therapeutic Exercise: Recumbent bike L3 x 5 min  Quad stretch 1 x 30 sec bil Calf stretch slant board 2 x 30 sec L hip abduction 2 sets going to fatigue with 5# Single leg bridge 2 sets going to fatigue LAQ 2 x going to fatigue 5# LLE Manual Therapy: MTPR along the L vastus lateralis Dead lift using small weighte 8# box, 2 x 15 Neuromuscular re-ed: SLS balance with UE ABC holding red physioball    [facility] Adult PT Treatment:                                                DATE: 03/23/2022 Therapeutic Exercise: Quad stretch 3x 30 sec  L Glute med stretch 2 x 30 sec in R sidelying L abduction in R sidelying 2 x 15 Seated hamstring stretch 2 x 30 sec Manual Therapy: MTPR along vastus lateralis, rectus femoris, glute med, semi-teindionsus/ membranosus Neuromuscular re-ed: Corner balance: Rhomberg 2 x 30 sec, Tandem 1 x 30 sec ea with, SLS 1x 30 sec   [facility] Adult PT Treatment:                                                DATE: 03/16/2022 Therapeutic Exercise: Recumbent bike L5 x 5 min  Standing calf stretch 2 x 30 sec on slant board Standing quad stretch 1 x 30 sec standing Dead lift using small weighte 8# box, 2 x 15 Neuromuscular re-ed: Balance with rebounder, rhomberg 1 x 15 with Red ball, modified tandeme 1 x 15 x 2 switching lead leg with red ball, progressed to full tandem 2 x 15 chanigng lead leg. Single leg balance 1 x  15 ea leg Repeated on airex pad    PATIENT EDUCATION:  Education details: POC Explanation of findings, Mc connell taping and FOTO report with handout, issued initial HEP Person educated: Patient Education method: Explanation and Demonstration Education comprehension: verbalized understanding and needs further education     HOME EXERCISE PROGRAM:  Access Code: [email or web address] URL: [email or web address] Date: 03/09/2022 Prepared by: Tony Sharp  Exercises - Bridge with Hamstring Curl on Swiss Ball  - 1 x daily - 7 x weekly -  3 sets - 10 reps - Sitting Knee Extension with Resistance  - 1 x daily - 7 x weekly - 3 sets - 10 reps - sidelying hip abduction ankle sock against wall  - 1 x daily - 7 x weekly - 3 sets - 10 reps - Standing Terminal Knee Extension with Resistance  - 1 x daily - 7 x weekly - 3 sets - 10 reps - Spanish or Goblet squats  - 1 x daily - 7 x weekly - 3 sets - 10 reps - Single Leg Bridge  - 1 x daily - 7 x weekly - 3 sets - 10 reps - Hip Extension with Resistance Loop  - 1 x daily - 7 x weekly - 2 sets - 15 reps - Side Stepping with Resistance at Thighs  - 1 x daily - 7 x weekly - 3 sets - 15 reps - Sidelying Hip External Rotation on Table  - 1 x daily - 7 x weekly - 2 sets - 20 reps - 1 hold - Sit to Stand with Resistance Around Legs  - 1 x daily - 7 x weekly - 2 sets - 20 reps - Long Sitting Isometric Ankle Inversion in Plantar Flexion with Ball at Wall  - 1 x daily - 7 x weekly - 2 sets - 20 reps - Lateral Step Down  - 1 x daily - 7 x weekly - 2 sets - 10 reps         ASSESSMENT:   CLINICAL IMPRESSION: Tony Sharp to session noting continued pain in the knee which he had an exacerbation that occurred since the last session as a result of a pop that occurred while he was sitting on a beam at school. He saw his MD and plans to get a MRI to assess the knee thinking potential meniscal involvement or potential OCD. Continued working on gross hip/ knee strengthening with increased reps to promote endurance. He did well with all exercises fatiguing quickest with hip strengthening.   OBJECTIVE IMPAIRMENTS decreased activity tolerance, decreased ROM, decreased strength, increased fascial restrictions, obesity, and pain.    ACTIVITY LIMITATIONS occupation.    PERSONAL FACTORS obesity, Left knee valgus are also affecting patient's functional outcome.      REHAB POTENTIAL: Good   CLINICAL DECISION MAKING: Evolving/moderate complexity   EVALUATION COMPLEXITY: Moderate     GOALS: Goals  reviewed with patient? Yes   SHORT TERM GOALS:   STG Name Target Date Goal status  1 Pt will be independent with initial HEP Baseline: no knowledge issued 02-21-22 pt pain 0/10 03/09/2022 Met 03/16/2022  2 Pt will decreased pain with stair climbing to 3/10 or less Baseline: 7/10 03/09/2022 Partially met 03/16/2022  3 Pt will be instructed in basic  strengthening principles using weights and gym equipment  Baseline: no knowledge 03/09/2022 Met 03/16/2022  4 Pt will begin walking program for at least 1 mile 3 x a week Baseline:no exercise at present 03/09/2022 Partially met 03/16/2022  LONG TERM GOALS:    LTG Name Target Date Goal status  1 Pt will be independent with advanced HEP Baseline:no knowledge 04/13/2022 INITIAL  2 Patient will report improved functional level on LEFS >/= 70/80 in order to allow for return to no pain/minimal pain with work activities  Baseline: LEFS 44/80 04/13/2022 INITIAL  3 Pt will be able to cross legs to don shoes on without pain Baseline:Pt has pain while crossing legs 04/13/2022 INITIAL  4 Pt will be able to climb ladder without exacerbating pain for work duties Baseline: Pt with 7/10 pain climbing ladder 04/13/2022 INITIAL  5 Pt will be able to incorporate walking program 3 days a week for 1-3 miles Baseline:no exercise 04/13/2022 INITIAL  6 FOTO will improve from  49%  to 70%    indicating improved functional mobility.  Baseline: 04/13/2022 INITIAL    PLAN: PT FREQUENCY: 2x/week   PT DURATION: 8 weeks   PLANNED INTERVENTIONS: Therapeutic exercises, Therapeutic activity, Neuromuscular re-education, Balance training, Gait training, Patient/Family education, Joint mobilization, Stair training, Dry Needling, Electrical stimulation, Cryotherapy, Moist heat, Taping, Ionotophoresis 3m/ml Dexamethasone, and Manual therapy   PLAN FOR NEXT SESSION:     Please check for any tape or perhaps neoprene sleeve option for patella alta working on strengthening instead of  bracing but may need to if strengthening not fully effective. Pt still having pain going up steps. How was MD visit?   Tony Sharp PT, DPT, LAT, ATC  04/04/22  3:10 PM     PHYSICAL THERAPY DISCHARGE SUMMARY  Visits from Start of Care: 10  Current functional level related to goals / functional outcomes: See goals   Remaining deficits: Current status unknown   Education / Equipment: HEP, theraband, lifting mechanics.    Patient agrees to discharge. Patient goals were partially met. Patient is being discharged due to not returning since the last visit.  Tony Sharp PT, DPT, LAT, ATC  05/22/22  12:56 PM

## 2022-04-04 NOTE — Patient Instructions (Signed)

## 2022-04-05 NOTE — Therapy (Incomplete)
?OUTPATIENT PHYSICAL THERAPY TREATMENT NOTE ? ? ?Patient Name: Tony Sharp ?MRN: [id number] ?DOB:[birth year], 26 y.o., adult ?Today's Date: 04/05/2022 ? ?PCP: Martyn Malay, MD ?REFERRING PROVIDER: Thurman Coyer, DO ? ? ? ? ? ? ? ? ? ? ? ? ?Past Medical History:  ?Diagnosis Date  ? ADHD   ? Anxiety   ? Gender dysphoria   ? ?Past Surgical History:  ?Procedure Laterality Date  ? MASTECTOMY    ? ?Patient Active Problem List  ? Diagnosis Date Noted  ? Recurrent cystitis 02/16/2021  ? MDD (major depressive disorder) 02/16/2021  ? Vitamin B12 deficiency 08/25/2020  ? Insomnia 06/14/2020  ? 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 ? ?REFERRING DIAG: M25.562,G89.29 (ICD-10-CM) - Chronic pain of left knee ? ?THERAPY DIAG:  ?No diagnosis found. ? ?PERTINENT HISTORY: Mastectomy,male to male transgender. No issues remarkable affecting knee except obesity, Left knee valgus ? ?PRECAUTIONS: none ? ?SUBJECTIVE:"The knee finally started to stop hurting alittle, then I was sitting on a beam at school and with reaching I noticed my knee cracked and I felt, the MD reports he wants me to get an MRI." ? ?PAIN:  ?Are you having pain? Yes: NPRS scale: 3/10 ?Pain location: L knee ?Pain description: ache ?Aggravating factors: walking , bending the knee ?Relieving factors: sitting, resting ? ? ? ? ?OBJECTIVE:  ?**Measures assessed at evaluation, reassessment noted by date**  ? ?DIAGNOSTIC FINDINGS: IMPRESSION:01-13-22 ?1. Moderate patella alta. Left knee ?2. Mild medial compartment joint space narrowing. ?  ?PATIENT SURVEYS:  ?LEFS 44/80 ?FOTO 49%  predicted 70% ?03/09/2022 59% ?04/04/2022 57% ?  ?COGNITION: ?         Overall cognitive status: Within functional limits for tasks assessed              ?          ?SENSATION: ?         Light touch: Appears intact ?         Stereognosis: Appears intact ?         Hot/Cold: Appears intact ?         Proprioception: Appears intact ?  ?MUSCLE LENGTH: ?Hamstrings: Right 55 deg; Left 56  deg ?Thomas test: Right 15 deg; Left 15 deg ?  ?POSTURE:  ?Increased lordotic curve, obesity, knee valgus ?Patella alta bil L>R ?  ?PALPATION: ?TTP medial joint line, and peri-patellar ?  ?LE AROM/PROM: ?  ?A/PROM Right ?02/16/2022 Left ?02/16/2022 Left ?04/04/2022  ?Hip flexion 120 120   ?Hip extension       ?Hip abduction       ?Hip adduction       ?Hip internal rotation       ?Hip external rotation       ?Knee flexion 128/135 128/130   ?Knee extension 0 0   ?Ankle dorsiflexion 8 10   ?Ankle plantarflexion       ?Ankle inversion       ?Ankle eversion       ? (Blank rows = not tested) ?  ?LE MMT: ?  ?MMT Right ?02/16/2022 Left ?02/16/2022 Right ?04/04/2022 Left ?04/04/2022  ?Hip flexion 4+ 4+    ?Hip extension 4 4    ?Hip abduction 4 4-    ?Hip adduction        ?Hip internal rotation        ?Hip external rotation        ?Knee flexion 4+ 4+    ?Knee extension 4+ 4    ?  Ankle dorsiflexion        ?Ankle plantarflexion        ?Ankle inversion        ?Ankle eversion        ? (Blank rows = not tested) ?  ?LOWER EXTREMITY SPECIAL TESTS:  ?NT ?02/28/2022 ?R: 1.5 cm navicular drop ?L: 1.3 cm Navculare drop ? ?FUNCTIONAL TESTS:  ?5 times sit to stand: 7.61 sec  normal ?  ?GAIT: ?Distance walked: 200 ?Assistive device utilized: None ?Level of assistance: Complete Independence ?Comments: Pt able to negotiate steps with alternating step pattern but pt does describe Left knee pain with with ascending and descending steps ?  ?  ?  ?TODAY'S TREATMENT: ?[facility] Adult PT Treatment:                                                DATE: 04/04/2022 ?Therapeutic Exercise: ?Recumbent bike L3 x 5 min  ?Quad stretch 1 x 30 sec bil ?Calf stretch slant board 2 x 30 sec ?L hip abduction 2 sets going to fatigue with 5# ?Single leg bridge 2 sets going to fatigue ?LAQ 2 x going to fatigue 5# LLE ?Manual Therapy: ?MTPR along the L vastus lateralis ?Dead lift using small weighte 8# box, 2 x 15 ?Neuromuscular re-ed: ?SLS balance with UE ABC holding red  physioball ? ? ? ?[facility] Adult PT Treatment:                                                DATE: 03/23/2022 ?Therapeutic Exercise: ?Quad stretch 3x 30 sec  ?L Glute med stretch 2 x 30 sec in R sidelying ?L abduction in R sidelying 2 x 15 ?Seated hamstring stretch 2 x 30 sec ?Manual Therapy: ?MTPR along vastus lateralis, rectus femoris, glute med, semi-teindionsus/ membranosus ?Neuromuscular re-ed: ?Corner balance: Rhomberg 2 x 30 sec, Tandem 1 x 30 sec ea with, SLS 1x 30 sec ? ? ?[facility] Adult PT Treatment:                                                DATE: 03/16/2022 ?Therapeutic Exercise: ?Recumbent bike L5 x 5 min  ?Standing calf stretch 2 x 30 sec on slant board ?Standing quad stretch 1 x 30 sec standing ?Dead lift using small weighte 8# box, 2 x 15 ?Neuromuscular re-ed: ?Balance with rebounder, rhomberg 1 x 15 with Red ball, modified tandeme 1 x 15 x 2 switching lead leg with red ball, progressed to full tandem 2 x 15 chanigng lead leg. Single leg balance 1 x 15 ea leg ?Repeated on airex pad ? ?  ?PATIENT EDUCATION:  ?Education details: POC Explanation of findings, Mc connell taping and FOTO report with handout, issued initial HEP ?Person educated: Patient ?Education method: Explanation and Demonstration ?Education comprehension: verbalized understanding and needs further education ?  ?  ?HOME EXERCISE PROGRAM: ? ?Access Code: [email or web address] ?URL: [email or web address] ?Date: 03/09/2022 ?Prepared by: Starr Lake ? ?Exercises ?- Bridge with Hamstring Curl on [employer]  - 1 x daily - 7 x weekly - 3 sets - 10 reps ?- Sitting Knee Extension  with Resistance  - 1 x daily - 7 x weekly - 3 sets - 10 reps ?- sidelying hip abduction ankle sock against wall  - 1 x daily - 7 x weekly - 3 sets - 10 reps ?- Standing Terminal Knee Extension with Resistance  - 1 x daily - 7 x weekly - 3 sets - 10 reps ?- Spanish or Goblet squats  - 1 x daily - 7 x weekly - 3 sets - 10 reps ?- Single Leg Bridge  - 1 x daily - 7 x  weekly - 3 sets - 10 reps ?- Hip Extension with Resistance Loop  - 1 x daily - 7 x weekly - 2 sets - 15 reps ?- Side Stepping with Resistance at Thighs  - 1 x daily - 7 x weekly - 3 sets - 15 reps ?- Sidelying Hip External Rotation on Table  - 1 x daily - 7 x weekly - 2 sets - 20 reps - 1 hold ?- Sit to Stand with Resistance Around Legs  - 1 x daily - 7 x weekly - 2 sets - 20 reps ?- Long Sitting Isometric Ankle Inversion in Plantar Flexion with Ball at [place]  - 1 x daily - 7 x weekly - 2 sets - 20 reps ?- Lateral Step Down  - 1 x daily - 7 x weekly - 2 sets - 10 reps ? ? ? ? ?  ?  ?ASSESSMENT: ?  ?CLINICAL IMPRESSION: ?Ainsley arrives to session noting continued pain in the knee which he had an exacerbation that occurred since the last session as a result of a pop that occurred while he was sitting on a beam at school. He saw his MD and plans to get a MRI to assess the knee thinking potential meniscal involvement or potential OCD. Continued working on gross hip/ knee strengthening with increased reps to promote endurance. He did well with all exercises fatiguing quickest with hip strengthening.  ? ?OBJECTIVE IMPAIRMENTS decreased activity tolerance, decreased ROM, decreased strength, increased fascial restrictions, obesity, and pain.  ?  ?ACTIVITY LIMITATIONS occupation.  ?  ?PERSONAL FACTORS obesity, Left knee valgus are also affecting patient's functional outcome.  ?  ?  ?REHAB POTENTIAL: Good ?  ?CLINICAL DECISION MAKING: Evolving/moderate complexity ?  ?EVALUATION COMPLEXITY: Moderate ?  ?  ?GOALS: ?Goals reviewed with patient? Yes ?  ?SHORT TERM GOALS: ?  ?STG Name Target Date Goal status  ?1 Pt will be independent with initial HEP ?Baseline: no knowledge issued 02-21-22 pt pain 0/10 03/09/2022 Met ?03/16/2022  ?2 Pt will decreased pain with stair climbing to 3/10 or less ?Baseline: 7/10 03/09/2022 Partially met ?03/16/2022  ?3 Pt will be instructed in basic  strengthening principles using weights and gym equipment   ?Baseline: no knowledge 03/09/2022 Met ?03/16/2022  ?4 Pt will begin walking program for at least 1 mile 3 x a week ?Baseline:no exercise at present 03/09/2022 Partially met ?03/16/2022  ?  ?LONG TERM GOALS:  ?  ?LTG Name Target Da

## 2022-04-06 ENCOUNTER — Ambulatory Visit: Payer: BC Managed Care – PPO | Admitting: Physical Therapy

## 2022-04-06 ENCOUNTER — Telehealth: Payer: Self-pay | Admitting: Physical Therapy

## 2022-04-06 NOTE — Telephone Encounter (Signed)
LVM for Tony Sharp checking on NO SHOW appt today and reminding pt of cancellation policy.  Pt told to call office to let us know if he plans to attend remaining two appt on schedule or if he would prefer to DC at this time with his HEP.  Pt schedule for Knee MRI on 04-09-22.  ? ?Voncille Lo, PT, ATRIC ?Certified Exercise Expert for the Aging Adult  ?04/06/22 12:15 PM ?Phone: [phone] ?Fax: [phone]  ?

## 2022-04-09 ENCOUNTER — Ambulatory Visit
Admission: RE | Admit: 2022-04-09 | Discharge: 2022-04-09 | Disposition: A | Discharge status: Home / Self Care | Payer: BC Managed Care – PPO | Source: Ambulatory Visit | Attending: Sports Medicine | Admitting: Sports Medicine

## 2022-04-09 DIAGNOSIS — G8929 Other chronic pain: Secondary | ICD-10-CM

## 2022-04-10 ENCOUNTER — Telehealth: Payer: Self-pay | Admitting: Sports Medicine

## 2022-04-10 NOTE — Telephone Encounter (Signed)
?  I spoke with Tony Sharp on the phone today after reviewing MRI findings of his left knee.  MRI does confirm a tear of the medial meniscus.  He has a complex macerated type tear involving the posterior horn and mid body junction.  Remainder of his knee is unremarkable.  Based on these findings I recommended surgical consultation with Dr. Rhona Raider to discuss arthroscopy.  Tony Sharp is in agreement with that plan.  He will follow-up with me as needed. ?

## 2022-04-11 ENCOUNTER — Ambulatory Visit: Payer: BC Managed Care – PPO | Admitting: Physical Therapy

## 2022-04-11 NOTE — Telephone Encounter (Signed)
Dr. Jerl Santos ?[facility] ?[address] ?[phone] ? ?Appt: 04/12/22 @ 2:30 pm ?

## 2022-04-13 ENCOUNTER — Ambulatory Visit: Payer: BC Managed Care – PPO | Admitting: Physical Therapy

## 2022-05-23 ENCOUNTER — Encounter: Payer: Self-pay | Admitting: *Deleted

## 2022-12-06 ENCOUNTER — Ambulatory Visit (INDEPENDENT_AMBULATORY_CARE_PROVIDER_SITE_OTHER): Payer: BC Managed Care – PPO | Admitting: Family Medicine

## 2022-12-06 ENCOUNTER — Encounter: Payer: Self-pay | Admitting: Family Medicine

## 2022-12-06 VITALS — BP 112/68 | HR 105 | Ht 67.0 in | Wt 244.8 lb

## 2022-12-06 DIAGNOSIS — F322 Major depressive disorder, single episode, severe without psychotic features: Secondary | ICD-10-CM

## 2022-12-06 DIAGNOSIS — F649 Gender identity disorder, unspecified: Secondary | ICD-10-CM | POA: Diagnosis not present

## 2022-12-06 DIAGNOSIS — Z789 Other specified health status: Secondary | ICD-10-CM | POA: Diagnosis not present

## 2022-12-06 MED ORDER — TESTOSTERONE CYPIONATE 200 MG/ML IM SOLN: INTRAMUSCULAR | 0 refills | Status: DC

## 2022-12-06 MED ORDER — "BD SAFETYGLIDE NEEDLE 25G X 5/8"" MISC": 3 refills | Status: AC

## 2022-12-06 MED ORDER — "BD BLUNT FILL NEEDLE 18G X 1-1/2"" MISC": 12 refills | Status: DC

## 2022-12-06 MED ORDER — BD SYRINGE LUER-LOK 1 ML MISC: 2 refills | Status: AC

## 2022-12-07 ENCOUNTER — Encounter: Payer: Self-pay | Admitting: Family Medicine

## 2022-12-07 NOTE — Progress Notes (Signed)
    CHIEF COMPLAINT / HPI:   Follow-up gender affirming hormone therapy.  Has had trouble getting 18-gauge needles.  Also has some difficulty remembering to take his testosterone weekly.  Says sometimes he will go 2 weeks without a shot. Currently satisfied with transition.  Has had bilateral mastectomy.  Does not want to consider any further surgical interventions at this time.  He is considering voice therapy. 3.  Continues to follow with psychiatry for mood disorder.  Feeling pretty positive about that right now.  Also follows with psychiatry for attention deficit disorder and they are managing medicines for both of those issues.  PERTINENT  PMH / PSH: I have reviewed the patient's medications, allergies, past medical and surgical history, smoking status and updated in the EMR as appropriate.   OBJECTIVE:  BP 112/68   Pulse (!) 105   Ht 5\' 7"  (1.702 m)   Wt 244 lb 12.8 oz (111 kg)   SpO2 97%   BMI 38.34 kg/m  GENERAL: Well-developed, no acute distress CV: Regular rate and rhythm RESPIRATORY: No increased work of breathing, normal respiratory rate. PSYCH: AxOx4. Good eye contact.. No psychomotor retardation or agitation. Appropriate speech fluency and content. Asks and answers questions appropriately. Mood is congruent.   ASSESSMENT / PLAN:   Male-to-male transgender person Refills given today.  He admits not being totally compliant with medications for the last 2 or 3 weeks so we decided to check blood work in about a month.  I have placed future orders.  Otherwise, we will follow-up 3 to 6 months and as needed.  MDD (major depressive disorder) Continue follow-up with psychiatry for medicine management and for therapy.   MD

## 2022-12-07 NOTE — Assessment & Plan Note (Signed)
Refills given today.  He admits not being totally compliant with medications for the last 2 or 3 weeks so we decided to check blood work in about a month.  I have placed future orders.  Otherwise, we will follow-up 3 to 6 months and as needed.

## 2022-12-07 NOTE — Assessment & Plan Note (Signed)
Continue follow-up with psychiatry for medicine management and for therapy.

## 2022-12-08 ENCOUNTER — Telehealth: Payer: Self-pay

## 2022-12-08 NOTE — Telephone Encounter (Signed)
Rec'd PA request for patients testosterone.   Archived PA request. Pt previously stated that he is aware medication isnt covered and he pays cash.

## 2023-03-08 NOTE — Progress Notes (Signed)
SUBJECTIVE:   CHIEF COMPLAINT: check on medications HPI:   Tony Sharp is a 27 y.o.  with history notable for FTM transgender status presenting for follow up and ear concerns.  He reports several concerns. 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 He is very busy currently.  He reports he just restarted his testosterone. Has had no side effects. Last injection 1 week ago.  The patient has a history of L sided hearing changes and tinnitus. Previously saw ENT who felt this was due to wax. He uses peroxide. No qtips. Tinnitus is at night, nonpulsatile. This is accompanied by muffled hearing that changes with position.   He reports several months or longer of R sided jaw pain. Worse at end of day. No associated eye or headache symptoms. No claudication. Jaw does catch at times. He is unsure if he grinds his teeth, dentist did not mention evidence at visit   He is RHD. He reports 4/5th digit numbness at night. No neck pain, weakness. Improves with flicking his hands. Only bothers him at night. Has tried nothing for this.  Other major concern today is insomnia. This is a longstanding issue. 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  Drinks 1-2 cups of coffee. Has eliminated sodas.  Uses adderall XL which is unchanged. Takes at 48 AM.  No alcohol use Bedroom is quiet and dark, lots of white noise Does not have consistent bedtime but wakes up around 830 daily    He has a therapist and has discussed with them   PERTINENT  PMH / PSH/Family/Social History :  Obesity--weight up a bit Has had HPV vaccines Flu and covid were given 09/23/22   OBJECTIVE:   BP 128/82   Pulse 84   Ht 5\' 7"  (1.702 m)   Wt 254 lb 3.2 oz (115.3 kg)   SpO2 99%   BMI 39.81 kg/m   Today's weight:  Last Weight  Most recent update: 03/12/2023  8:49 AM    Weight  115.3 kg (254 lb 3.2 oz)            Review of prior  weights: [employer]   03/12/23 0848  Weight: 254 lb 3.2 oz (115.3 kg)    Cardiac: Regular rate and rhythm. Normal S1/S2. No murmurs, rubs, or gallops appreciated. Lungs: Clear bilaterally to ascultation.  Abdomen: Normoactive bowel sounds. No tenderness to deep or light palpation. No rebound or guarding.  Psych: Pleasant and appropriate  HEENT HEENT: EOMI. Sclera without injection or icterus. MMM. External auditory canal examined and WNL. TM normal appearance, no erythema or bulging.Minimal cerumen Dentition examined and is excellent No TTP over TMJ but + popping  Neck: Supple.  Full ROM No pain with ROM NO TTP Full strength of UE including right and left hand Normal sensation bilaterally in UE     ASSESSMENT/PLAN:   Male-to-male transgender person Monitoring labs today Continue current dose Level will likely be low due to injection timing    Insomnia Discussed options Consider CBTi   R Hand Numbness Likely cubital tunnel Discussed night splints for elbow  Hearing Changes Unclear cause  No cerumen impaction, TM relatively normal Referral to ENT for hearing evaluation   Possible TMJ  Unclear cause, differential also considered referred ear pain, TA but both much less likely Trial PT  HCM Hep C completed today  Received 2 HPV vaccines already Had flu  and COVID     Dorris Singh, MD  [facility]

## 2023-03-12 ENCOUNTER — Ambulatory Visit: Payer: BC Managed Care – PPO | Admitting: Family Medicine

## 2023-03-12 ENCOUNTER — Encounter: Payer: Self-pay | Admitting: Family Medicine

## 2023-03-12 VITALS — BP 128/82 | HR 84 | Ht 67.0 in | Wt 254.2 lb

## 2023-03-12 DIAGNOSIS — Z1159 Encounter for screening for other viral diseases: Secondary | ICD-10-CM

## 2023-03-12 DIAGNOSIS — H9312 Tinnitus, left ear: Secondary | ICD-10-CM

## 2023-03-12 DIAGNOSIS — Z789 Other specified health status: Secondary | ICD-10-CM

## 2023-03-12 DIAGNOSIS — R6884 Jaw pain: Secondary | ICD-10-CM | POA: Diagnosis not present

## 2023-03-12 NOTE — Assessment & Plan Note (Signed)
Monitoring labs today Continue current dose Level will likely be low due to injection timing

## 2023-03-12 NOTE — Patient Instructions (Signed)
It was wonderful to see you today.  Please bring ALL of your medications with you to every visit.   Today we talked about:  For your sleep  - You are doing pretty good with sleep hygiene - Please try to go to bed at a consistent time around 10 PM every night   - Try the following to help you sleep better:  - limit naps during the day  - no screens (TV, phone, tablet, computer) at least 1-2 hours before bedtime.  - have a quiet and dark sleeping environment.  - no large meals or drinks about 1 hour before bed.  - Avoid taking diuretics (hydrochlorothiazide, furosemide) in the evenings.  - Avoid caffeine after 3pm.  - Exercise or move your body regularly every day.  - You can also try melatonin 10 mg over the counter. Take this 1-2 hours before bed. You can increase to 20mg  if this is not helpful. - If you are lying in bed for 30 mins-1 hour and aren't falling asleep, get out of bed and do something relaxing like reading (NO TV!) until you are tired.  For your arm please try a elbow splint--I recommend one from [facility]  For your jaw Pain, I recommend Physical Therapy  For your ears, I recommend seeing ENT again  I will message you with blood work   Please follow up in 3 months   Thank you for choosing [facility].   Please call [phone] with any questions about today's appointment.  Please be sure to schedule follow up at the front  desk before you leave today.   Dorris Singh, MD  Family Medicine

## 2023-03-13 LAB — CBC
Hematocrit: 44.8 % (ref 37.5–51.0)
Hemoglobin: 15 g/dL (ref 13.0–17.7)
MCH: 30.2 pg (ref 26.6–33.0)
MCHC: 33.5 g/dL (ref 31.5–35.7)
MCV: 90 fL (ref 79–97)
Platelets: 335 10*3/uL (ref 150–450)
RBC: 4.96 x10E6/uL (ref 4.14–5.80)
RDW: 12.9 % (ref 11.6–15.4)
WBC: 5.2 10*3/uL (ref 3.4–10.8)

## 2023-03-13 LAB — TESTOSTERONE: Testosterone: 271 ng/dL (ref 264–916)

## 2023-03-13 LAB — LIPID PANEL
Chol/HDL Ratio: 5.6 ratio — ABNORMAL HIGH (ref 0.0–5.0)
Cholesterol, Total: 162 mg/dL (ref 100–199)
HDL: 29 mg/dL — ABNORMAL LOW (ref >39)
LDL Chol Calc (NIH): 115 mg/dL — ABNORMAL HIGH (ref 0–99)
Triglycerides: 98 mg/dL (ref 0–149)
VLDL Cholesterol Cal: 18 mg/dL (ref 5–40)

## 2023-03-13 LAB — COMPREHENSIVE METABOLIC PANEL
ALT: 28 IU/L (ref 0–44)
AST: 20 IU/L (ref 0–40)
Albumin/Globulin Ratio: 2 (ref 1.2–2.2)
Albumin: 4.8 g/dL (ref 4.3–5.2)
Alkaline Phosphatase: 105 IU/L (ref 44–121)
BUN/Creatinine Ratio: 9 (ref 9–20)
BUN: 9 mg/dL (ref 6–20)
Bilirubin Total: 0.4 mg/dL (ref 0.0–1.2)
CO2: 24 mmol/L (ref 20–29)
Calcium: 9.9 mg/dL (ref 8.7–10.2)
Chloride: 100 mmol/L (ref 96–106)
Creatinine, Ser: 0.98 mg/dL (ref 0.76–1.27)
Globulin, Total: 2.4 g/dL (ref 1.5–4.5)
Glucose: 75 mg/dL (ref 70–99)
Potassium: 4.2 mmol/L (ref 3.5–5.2)
Sodium: 138 mmol/L (ref 134–144)
Total Protein: 7.2 g/dL (ref 6.0–8.5)
eGFR: 110 mL/min/{1.73_m2} (ref >59)

## 2023-03-13 LAB — HEMOGLOBIN A1C
Est. average glucose Bld gHb Est-mCnc: 105 mg/dL
Hgb A1c MFr Bld: 5.3 % (ref 4.8–5.6)

## 2023-03-13 LAB — HCV AB W REFLEX TO QUANT PCR: HCV Ab: NONREACTIVE

## 2023-03-13 LAB — HCV INTERPRETATION

## 2023-03-14 ENCOUNTER — Encounter: Payer: Self-pay | Admitting: *Deleted

## 2023-04-05 ENCOUNTER — Encounter: Payer: Self-pay | Admitting: Family Medicine

## 2023-04-05 DIAGNOSIS — G4719 Other hypersomnia: Secondary | ICD-10-CM

## 2023-04-30 ENCOUNTER — Telehealth: Payer: BC Managed Care – PPO | Admitting: Physician Assistant

## 2023-04-30 DIAGNOSIS — R3989 Other symptoms and signs involving the genitourinary system: Secondary | ICD-10-CM | POA: Diagnosis not present

## 2023-04-30 MED ORDER — NITROFURANTOIN MONOHYD MACRO 100 MG PO CAPS: 100.0000 mg | ORAL_CAPSULE | Freq: Two times a day (BID) | ORAL | 0 refills | Status: DC

## 2023-04-30 NOTE — Progress Notes (Signed)
E-Visit for Urinary Problems  We are sorry that you are not feeling well.  Here is how we plan to help!  Based on what you shared with me it looks like you most likely have a simple urinary tract infection.  A UTI (Urinary Tract Infection) is a bacterial infection of the bladder.  Most cases of urinary tract infections are simple to treat but a key part of your care is to encourage you to drink plenty of fluids and watch your symptoms carefully.  I have prescribed MacroBid 100 mg twice a day for 5 days.  Your symptoms should gradually improve. Call us if the burning in your urine worsens, you develop worsening fever, back pain or pelvic pain or if your symptoms do not resolve after completing the antibiotic.  Urinary tract infections can be prevented by drinking plenty of water to keep your body hydrated.  Also be sure when you wipe, wipe from front to back and don't hold it in!  If possible, empty your bladder every 4 hours.  HOME CARE Drink plenty of fluids Compete the full course of the antibiotics even if the symptoms resolve Remember, when you need to go.go. Holding in your urine can increase the likelihood of getting a UTI! GET HELP RIGHT AWAY IF: You cannot urinate You get a high fever Worsening back pain occurs You see blood in your urine You feel sick to your stomach or throw up You feel like you are going to pass out  MAKE SURE YOU  Understand these instructions. Will watch your condition. Will get help right away if you are not doing well or get worse.   Thank you for choosing an e-visit.  Your e-visit answers were reviewed by a board certified advanced clinical practitioner to complete your personal care plan. Depending upon the condition, your plan could have included both over the counter or prescription medications.  Please review your pharmacy choice. Make sure the pharmacy is open so you can pick up prescription now. If there is a problem, you may contact your  provider through MyChart messaging and have the prescription routed to another pharmacy.  Your safety is important to us. If you have drug allergies check your prescription carefully.   For the next 24 hours you can use MyChart to ask questions about today's visit, request a non-urgent call back, or ask for a work or school excuse. You will get an email in the next two days asking about your experience. I hope that your e-visit has been valuable and will speed your recovery.  I have spent 5 minutes in review of e-visit questionnaire, review and updating patient chart, medical decision making and response to patient.   Sunny Gains M Rosaire Cueto, PA-C  

## 2023-05-05 ENCOUNTER — Ambulatory Visit
Admission: RE | Admit: 2023-05-05 | Discharge: 2023-05-05 | Disposition: A | Discharge status: Home / Self Care | Payer: BC Managed Care – PPO | Source: Ambulatory Visit | Attending: Internal Medicine | Admitting: Internal Medicine

## 2023-05-05 VITALS — BP 117/79 | HR 74 | Temp 97.7°F | Resp 20

## 2023-05-05 DIAGNOSIS — R350 Frequency of micturition: Secondary | ICD-10-CM | POA: Insufficient documentation

## 2023-05-05 DIAGNOSIS — R300 Dysuria: Secondary | ICD-10-CM

## 2023-05-05 LAB — POCT URINALYSIS DIP (MANUAL ENTRY)
Bilirubin, UA: NEGATIVE
Blood, UA: NEGATIVE
Glucose, UA: NEGATIVE mg/dL
Ketones, POC UA: NEGATIVE mg/dL
Nitrite, UA: NEGATIVE
Protein Ur, POC: NEGATIVE mg/dL
Spec Grav, UA: 1.025 (ref 1.010–1.025)
Urobilinogen, UA: 0.2 E.U./dL
pH, UA: 6 (ref 5.0–8.0)

## 2023-05-05 MED ORDER — SULFAMETHOXAZOLE-TRIMETHOPRIM 800-160 MG PO TABS: 1.0000 | ORAL_TABLET | Freq: Two times a day (BID) | ORAL | 0 refills | Status: AC

## 2023-05-05 NOTE — ED Triage Notes (Signed)
Pt with UTI starting 6-7 days ago, had an E-visit, prescribed abx, sxs resolved after two days and 2-3 days ago sxs resumed. Completed abx this am. C/O urgency, frequency, pain with urination, abd pressure.  No back pain, F or chills.

## 2023-05-05 NOTE — ED Provider Notes (Signed)
[facility]  Note:  This document was prepared using Dragon voice recognition software and may include unintentional dictation errors.  MRN: [id number] DOB: 1996-12-08 DATE: 05/05/23   Subjective:  Chief Complaint:  Chief Complaint  Patient presents with   Urinary Frequency    UTI symptoms returned after antibiotics - Entered by patient    HPI: Tony Sharp is a 27 y.o. adult with history notable for FTM transgender status presenting for dysuria and increased urinary frequency. He states he started developing UTI symptoms approximately 6-7 days ago. He had an E-Visit at that time and was prescribed Macrobid. He states he was improving until 2-3 days ago when the symptoms started returning. He states he completed the ABX as directed today, but symptoms have continued and his at home UA was positive for leukocytes and negative for nitrates. He reports increased urge to urinate and feeling as if he has not completely emptied his bladder when he has urinated. He also has been urinating more frequently and has had some burning with urination. Denies fever, nausea/vomiting, back pain, hematuria, diarrhea/constipation, vaginal discharge. Endorses dysuria, increased urinary frequency, increased urge, and lower abdominal pain. Presents NAD.  Prior to Admission medications   Medication Sig Start Date End Date Taking? Authorizing Provider  gabapentin (NEURONTIN) 100 MG capsule Take 100 mg by mouth at bedtime. 04/27/23  Yes [provider]  magnesium (MAGTAB) 84 MG ( ) TBCR SR tablet Take 84 mg by mouth.   Yes [provider]  amphetamine-dextroamphetamine (ADDERALL XR) 10 MG 24 hr capsule Take 20 mg by mouth daily.    [provider]  busPIRone (BUSPAR) 30 MG tablet Take 30 mg by mouth 2 (two) times daily.    [provider]  Cyanocobalamin 1000 MCG CAPS Take 1 tablet by mouth daily 07/13/20   Westley Chandler, MD  DULoxetine (CYMBALTA) 60 MG capsule Take 1  capsule (60 mg total) by mouth 2 (two) times daily. 02/15/21   Dana Allan, MD  NEEDLE, DISP, 18 G (B-D BLUNT FILL NEEDLE) 18G X 1-1/2" MISC Use as directed with testosterone 12/06/22   Nestor Ramp, MD  NEEDLE, DISP, 25 G (BD SAFETYGLIDE NEEDLE) 25G X 5/8" MISC Use as directed 12/06/22   Nestor Ramp, MD  nitrofurantoin, macrocrystal-monohydrate, (MACROBID) 100 MG capsule Take 1 capsule (100 mg total) by mouth 2 (two) times daily. 04/30/23   Margaretann Loveless, PA-C  Syringe, Disposable, (B-D SYRINGE LUER-LOK 1CC) 1 ML MISC Use as directed with testosterone injection 12/06/22   Nestor Ramp, MD  testosterone cypionate (DEPOTESTOSTERONE CYPIONATE) 200 MG/ML injection Inject 60 mg subcutaneously each week 12/06/22   Nestor Ramp, MD     No Known Allergies  History:   Past Medical History:  Diagnosis Date   ADHD    Anxiety    Gender dysphoria      Past Surgical History:  Procedure Laterality Date   MASTECTOMY      History reviewed. No pertinent family history.  Social History   Tobacco Use   Smoking status: Never   Smokeless tobacco: Never  Vaping Use   Vaping Use: Never used  Substance Use Topics   Alcohol use: Yes    Comment: 1-2 times per month  (has been using more through pandemic)    Drug use: Never    Review of Systems  Constitutional:  Negative for fever.  Gastrointestinal:  Positive for abdominal pain. Negative for constipation, diarrhea, nausea and vomiting.  Genitourinary:  Positive  for dysuria, frequency and urgency. Negative for flank pain, hematuria and vaginal discharge.  Musculoskeletal:  Negative for back pain.     Objective:   Vitals: BP 117/79 (BP Location: Right Arm)   Pulse 74   Temp 97.7 F (36.5 C) (Oral)   Resp 20   SpO2 98%   Physical Exam Constitutional:      General: He is not in acute distress.    Appearance: Normal appearance. He is well-developed and overweight. He is not ill-appearing or toxic-appearing.  HENT:     Head:  Normocephalic and atraumatic.  Cardiovascular:     Rate and Rhythm: Normal rate and regular rhythm.     Heart sounds: Normal heart sounds.  Pulmonary:     Effort: Pulmonary effort is normal.     Breath sounds: Normal breath sounds.     Comments: Clear to auscultation bilaterally  Abdominal:     General: Bowel sounds are normal.     Palpations: Abdomen is soft.     Tenderness: There is no abdominal tenderness. There is no right CVA tenderness or left CVA tenderness.  Musculoskeletal:     Lumbar back: Normal.  Skin:    General: Skin is warm and dry.  Neurological:     General: No focal deficit present.     Mental Status: He is alert.  Psychiatric:        Mood and Affect: Mood and affect normal.     Results:  Labs: Results for orders placed or performed during the hospital encounter of 05/05/23 (from the past 24 hour(s))  POCT urinalysis dipstick     Status: Abnormal   Collection Time: 05/05/23 10:24 AM  Result Value Ref Range   Color, UA yellow yellow   Clarity, UA clear clear   Glucose, UA negative negative mg/dL   Bilirubin, UA negative negative   Ketones, POC UA negative negative mg/dL   Spec Grav, UA 1.610 9.604 - 1.025   Blood, UA negative negative   pH, UA 6.0 5.0 - 8.0   Protein Ur, POC negative negative mg/dL   Urobilinogen, UA 0.2 0.2 or 1.0 E.U./dL   Nitrite, UA Negative Negative   Leukocytes, UA Trace (A) Negative    Radiology: No results found.   UC Course/Treatments:  Procedures: Procedures   Medications Ordered in UC: Medications - No data to display   Assessment and Plan :     ICD-10-CM   1. Dysuria  R30.0 Urine Culture    Urine Culture    2. Increased urinary frequency  R35.0      Dysuria: Afebrile, nontoxic-appearing, NAD. VSS. DDX includes but not limited to: Cystitis, pyelonephritis, candidiasis, BV UA was positive for trace leukocytes today in office.  Urine culture is pending.  Given continued dysuria as well as leukocytes, Bactrim  800-160 mg twice daily was prescribed.  No culture was performed due to ED visit previously.  Possible resistance to the Macrobid.  If urine culture is negative, then the Macrobid can be stopped.  Cytology is also pending.  Given lower abdominal pain and dysuria despite recent Macrobid usage, recommended cytology testing. Strict ED precautions were given and patient verbalized understanding.  Increased Urinary Frequency: See Notes Above   ED Discharge Orders          Ordered    sulfamethoxazole-trimethoprim (BACTRIM DS) 800-160 MG tablet  2 times daily        05/05/23 1032             PDMP  not reviewed this encounter.     Khayden Herzberg P, PA-C 05/05/23 1045

## 2023-05-05 NOTE — Discharge Instructions (Addendum)
You were prescribed Bactrim which is an antibiotic that is often used to treat urinary tract infections.  Take the prescription as directed. A urine culture has been sent to the lab for further testing.  We will call you with those results.  If the prescription needs to be changed, it will be done so at that time. Your swab was also sent to the lab for additional testing.  Return in 2 to 3 days if no improvement. Please go directly to the Emergency Department immediately should you begin to have any of the following symptoms: persistent fevers, increased pain or persistent nausea/vomiting.

## 2023-05-06 LAB — URINE CULTURE: Culture: NO GROWTH

## 2023-05-08 LAB — CERVICOVAGINAL ANCILLARY ONLY
Bacterial Vaginitis (gardnerella): NEGATIVE
Candida Glabrata: NEGATIVE
Candida Vaginitis: NEGATIVE
Chlamydia: NEGATIVE
Comment: NEGATIVE
Comment: NEGATIVE
Comment: NEGATIVE
Comment: NEGATIVE
Comment: NEGATIVE
Comment: NORMAL
Neisseria Gonorrhea: NEGATIVE
Trichomonas: NEGATIVE

## 2023-06-27 ENCOUNTER — Encounter: Payer: Self-pay | Admitting: Family Medicine

## 2023-07-24 DIAGNOSIS — G4700 Insomnia, unspecified: Secondary | ICD-10-CM | POA: Diagnosis not present

## 2023-07-24 DIAGNOSIS — F339 Major depressive disorder, recurrent, unspecified: Secondary | ICD-10-CM | POA: Diagnosis not present

## 2023-07-24 DIAGNOSIS — F900 Attention-deficit hyperactivity disorder, predominantly inattentive type: Secondary | ICD-10-CM | POA: Diagnosis not present

## 2023-07-24 DIAGNOSIS — F4010 Social phobia, unspecified: Secondary | ICD-10-CM | POA: Diagnosis not present

## 2023-09-04 DIAGNOSIS — F900 Attention-deficit hyperactivity disorder, predominantly inattentive type: Secondary | ICD-10-CM | POA: Diagnosis not present

## 2023-09-04 DIAGNOSIS — F4010 Social phobia, unspecified: Secondary | ICD-10-CM | POA: Diagnosis not present

## 2023-09-04 DIAGNOSIS — G2581 Restless legs syndrome: Secondary | ICD-10-CM | POA: Diagnosis not present

## 2023-09-04 DIAGNOSIS — G4700 Insomnia, unspecified: Secondary | ICD-10-CM | POA: Diagnosis not present

## 2023-09-25 DIAGNOSIS — R202 Paresthesia of skin: Secondary | ICD-10-CM | POA: Diagnosis not present

## 2023-09-25 DIAGNOSIS — R200 Anesthesia of skin: Secondary | ICD-10-CM | POA: Diagnosis not present

## 2023-10-02 DIAGNOSIS — R202 Paresthesia of skin: Secondary | ICD-10-CM | POA: Diagnosis not present

## 2023-10-02 DIAGNOSIS — R200 Anesthesia of skin: Secondary | ICD-10-CM | POA: Diagnosis not present

## 2023-10-04 DIAGNOSIS — G4700 Insomnia, unspecified: Secondary | ICD-10-CM | POA: Diagnosis not present

## 2023-10-04 DIAGNOSIS — Z79899 Other long term (current) drug therapy: Secondary | ICD-10-CM | POA: Diagnosis not present

## 2023-10-05 DIAGNOSIS — G4700 Insomnia, unspecified: Secondary | ICD-10-CM | POA: Diagnosis not present

## 2023-10-05 DIAGNOSIS — Z79899 Other long term (current) drug therapy: Secondary | ICD-10-CM | POA: Diagnosis not present

## 2023-10-06 DIAGNOSIS — G4700 Insomnia, unspecified: Secondary | ICD-10-CM | POA: Diagnosis not present

## 2023-10-08 ENCOUNTER — Other Ambulatory Visit: Payer: Self-pay

## 2023-10-08 ENCOUNTER — Ambulatory Visit (INDEPENDENT_AMBULATORY_CARE_PROVIDER_SITE_OTHER): Payer: Medicaid Other

## 2023-10-08 VITALS — BP 130/93 | HR 85 | Ht 67.0 in | Wt 241.4 lb

## 2023-10-08 DIAGNOSIS — Z72821 Inadequate sleep hygiene: Secondary | ICD-10-CM | POA: Diagnosis not present

## 2023-10-08 DIAGNOSIS — G4700 Insomnia, unspecified: Secondary | ICD-10-CM | POA: Insufficient documentation

## 2023-10-08 DIAGNOSIS — F322 Major depressive disorder, single episode, severe without psychotic features: Secondary | ICD-10-CM

## 2023-10-08 NOTE — Assessment & Plan Note (Signed)
Patient comes in for follow-up of his sleep concerns after being seen in the emergency department over the weekend.  Patient appreciates he has had sleep issues for years, but has been worse the last couple of months.  Patient appreciating difficulty falling asleep, and staying asleep.  Patient appreciates both are related to his restless legs that bother him and keep him awake/wake him.  Patient following with neurology about restless legs.  Patient also going to sleep late, and does not exercise, patient also taking Adderall XR 30 mg daily in the mornings.  Discussed that patient needing better sleep hygiene, more activity/exercise, and may need to change stimulant/come off of stimulants. - Daily exercise - Discussed stimulants with psychiatrist - Continue Atarax 25 mg nightly - Follow-up 2 weeks

## 2023-10-08 NOTE — Progress Notes (Signed)
SUBJECTIVE:   CHIEF COMPLAINT / HPI:   ED F/u -Seen in ED x 2 (10/17 & 10/18) for insomnia and passive SI related to insomnia -Takes adderal 10 mg XR   Patient appreciates sleep issues that have been going back years, but have been worse last couple months.  Patient reports difficulty falling asleep, and difficulty staying asleep.  Patient reports he is usually going to bed around 1 AM, and falling asleep around 3 AM, and then waking up around 7 AM, usually because of restless legs.  Patient appreciates he has a difficult time falling asleep also because of restless legs.  Patient reports he has been following with neurology about his restless legs.  Patient reports he follows with a psychiatrist, and will start following with the school therapist, for his mood.  Patient is working on his thesis in Orthoptist.  Patient reports stress is not very high, but does have a lot of stress about his lack of sleep.  Patient reports a lack of sleep is causing him to have a low mood, and have suicidal ideation.  Patient reports he does not have plans to commit suicide however he is stressed about a future where he does not get enough sleep. Patient reports taking Adderall early in the morning, 30 mg XR.  Patient does not exercise, finds he has a difficult time getting to exercise for his lack of energy.  PERTINENT  PMH / PSH:    OBJECTIVE:  BP (!) 130/93   Pulse 85   Ht 5\' 7"  (1.702 m)   Wt 241 lb 6.4 oz (109.5 kg)   SpO2 99%   BMI 37.81 kg/m  Physical Exam Constitutional:      General: He is not in acute distress.    Appearance: Normal appearance. He is not ill-appearing.  Pulmonary:     Effort: Pulmonary effort is normal.  Neurological:     Mental Status: He is alert.  Psychiatric:        Mood and Affect: Mood normal.        Speech: Speech normal.        Behavior: Behavior normal.        Thought Content: Thought content does not include suicidal ideation. Thought content does not  include suicidal plan.      ASSESSMENT/PLAN:  Poor sleep hygiene Assessment & Plan: Patient comes in for follow-up of his sleep concerns after being seen in the emergency department over the weekend.  Patient appreciates he has had sleep issues for years, but has been worse the last couple of months.  Patient appreciating difficulty falling asleep, and staying asleep.  Patient appreciates both are related to his restless legs that bother him and keep him awake/wake him.  Patient following with neurology about restless legs.  Patient also going to sleep late, and does not exercise, patient also taking Adderall XR 30 mg daily in the mornings.  Discussed that patient needing better sleep hygiene, more activity/exercise, and may need to change stimulant/come off of stimulants. - Daily exercise - Discussed stimulants with psychiatrist - Continue Atarax 25 mg nightly - Follow-up 2 weeks   Current severe episode of major depressive disorder without psychotic features, unspecified whether recurrent [facility]) Assessment & Plan: Patient appreciating suicidal ideation secondary to lack of sleep.  Patient has poor sleep hygiene, and poor quality of sleep.  Patient appreciates that his sleep problems are causing him distress, making him have suicidal ideation.  Patient denies any plan to harm himself/end life,  and feels safe.  Patient also feels that he can tell if he is headed in the wrong direction, and will get help as needed.  Patient follows with a psychiatrist for his mood disorder.  Will recommend close follow-up given psychological stress of poor quality sleep. - Follow-up 2 weeks - Recommendations for sleep made - Suicide hotline/emergency medical services reviewed with patient    No follow-ups on file. Bess Kinds, MD 10/08/2023, 2:05 PM PGY-3, [facility]

## 2023-10-08 NOTE — Patient Instructions (Signed)
It was great to see you! Thank you for allowing me to participate in your care!  Our plans for today:  - Sleep Issues I'm sorry to hear about your sleep issues. It may be coming from your Adderall. Let's see about adjusting it when you meet with your psychiatrist. Let's also try to get some daily exercise to see if this helps you fall/stay asleep.    If you feel that your life is in danger, related to your mood and lack of sleep, please call 911 for emergency medical services  If you are having worsening suicidal thoughts, or having a difficult time dealing with them, please call [phone] for the suicide hotline.  Take care and seek immediate care sooner if you develop any concerns.   Dr. Bess Kinds, MD [facility]

## 2023-10-08 NOTE — Assessment & Plan Note (Signed)
Patient appreciating suicidal ideation secondary to lack of sleep.  Patient has poor sleep hygiene, and poor quality of sleep.  Patient appreciates that his sleep problems are causing him distress, making him have suicidal ideation.  Patient denies any plan to harm himself/end life, and feels safe.  Patient also feels that he can tell if he is headed in the wrong direction, and will get help as needed.  Patient follows with a psychiatrist for his mood disorder.  Will recommend close follow-up given psychological stress of poor quality sleep. - Follow-up 2 weeks - Recommendations for sleep made - Suicide hotline/emergency medical services reviewed with patient

## 2023-10-09 ENCOUNTER — Encounter: Payer: Self-pay | Admitting: Student

## 2023-10-10 DIAGNOSIS — G2581 Restless legs syndrome: Secondary | ICD-10-CM | POA: Diagnosis not present

## 2023-10-10 DIAGNOSIS — G4709 Other insomnia: Secondary | ICD-10-CM | POA: Diagnosis not present

## 2023-10-10 DIAGNOSIS — F900 Attention-deficit hyperactivity disorder, predominantly inattentive type: Secondary | ICD-10-CM | POA: Diagnosis not present

## 2023-10-10 DIAGNOSIS — F4010 Social phobia, unspecified: Secondary | ICD-10-CM | POA: Diagnosis not present

## 2023-10-10 MED ORDER — HYDROXYZINE HCL 25 MG PO TABS: 25.0000 mg | ORAL_TABLET | Freq: Every evening | ORAL | 0 refills | Status: DC | PRN

## 2023-10-23 ENCOUNTER — Ambulatory Visit: Payer: Medicaid Other | Admitting: Student

## 2023-10-23 ENCOUNTER — Encounter: Payer: Self-pay | Admitting: Student

## 2023-10-23 VITALS — BP 139/79 | HR 104 | Ht 67.0 in | Wt 241.1 lb

## 2023-10-23 DIAGNOSIS — F19982 Other psychoactive substance use, unspecified with psychoactive substance-induced sleep disorder: Secondary | ICD-10-CM

## 2023-10-23 NOTE — Progress Notes (Signed)
  SUBJECTIVE:   CHIEF COMPLAINT / HPI:   Insomnia -seen 10/21 for insomnia 2/2 poor sleep hygiene and possibly stimulant -Rec exercise, atarax at bedtime,  -Psych recommend d/c adderrall for 5 days and try lunesta  Today: Patient comes in for follow-up of insomnia.  Patient appreciates that he is sleeping better.  Also appreciates mood is improved, having less suicidal ideation.  Patient appreciates that he stopped Adderall, and was able to get better sleep, but was having a difficult time functioning without Adderall.  Patient now on low-dose of Adderall, and also taking trazodone to help with sleep.  Patient plans to meet with psychiatrist on the 19th, to continue adjusting stimulants.  Patient feels like he is getting around 6 hours of sleep at night, reports still having poor sleep hygiene, and unable to exercise.  Patient appreciates he has started walking longer distances 1 parking, and taking the stairs instead of the elevator.  Patient reports taking reduced dose of Adderall, 7.5 mg of immediate release.  PERTINENT  PMH / PSH:    OBJECTIVE:  There were no vitals taken for this visit. Physical Exam Constitutional:      General: He is not in acute distress.    Appearance: Normal appearance. He is not ill-appearing.  Pulmonary:     Effort: Pulmonary effort is normal.  Neurological:     Mental Status: He is alert.  Psychiatric:        Mood and Affect: Mood and affect normal. Mood is not depressed.        Speech: Speech normal.        Behavior: Behavior normal. Behavior is cooperative.        Thought Content: Thought content does not include suicidal ideation. Thought content does not include suicidal plan.      ASSESSMENT/PLAN:   Assessment & Plan Drug-induced insomnia (HCC) Patient comes in for follow-up of his insomnia.  Patient appreciates that insomnia is coming from his Adderall use.  Patient appreciates with stopping Adderall, he had improved sleep, but had decreased in  his daily baseline functioning.  Patient working with psychiatrist to find alternative stimulant, but has decreased the dose.  Patient also taking trazodone until he meets with his psychiatrist again.  Appreciate trazodone is helping, getting 6 to 8 hours of sleep, at night.  Patient still appreciates poor sleep hygiene, and has not been exercising.  Patient appreciates mood is improved with better sleep, and understanding what is causing his sleep issues.  Patient notes that he has a low level of suicidal ideation at baseline, but denies any plans for suicide, and does not feel a threat to himself. - Continue trazodone - Follow-up with psychiatrist, about switching stimulants - Improve sleep hygiene - Exercise 3 to 5 days a week No follow-ups on file. Bess Kinds, MD 10/23/2023, 9:59 AM PGY-3, [facility]

## 2023-10-23 NOTE — Patient Instructions (Signed)
It was great to see you! Thank you for allowing me to participate in your care!  Our plans for today:  - Insomnia It looks like the cause of your poor sleep is coming from your stimulants.  At your convenience, make an appointment to see about switching stimulants, to help with your sleep.  -Continue trazodone, consider melatonin in the future - Continue to work on sleep hygiene  - Attempt to get exercise 3-5 times a week -Follow-up as needed  Take care and seek immediate care sooner if you develop any concerns.   Dr. Bess Kinds, MD [facility]

## 2023-10-23 NOTE — Assessment & Plan Note (Addendum)
Patient comes in for follow-up of his insomnia.  Patient appreciates that insomnia is coming from his Adderall use.  Patient appreciates with stopping Adderall, he had improved sleep, but had decreased in his daily baseline functioning.  Patient working with psychiatrist to find alternative stimulant, but has decreased the dose.  Patient also taking trazodone until he meets with his psychiatrist again.  Appreciate trazodone is helping, getting 6 to 8 hours of sleep, at night.  Patient still appreciates poor sleep hygiene, and has not been exercising.  Patient appreciates mood is improved with better sleep, and understanding what is causing his sleep issues.  Patient notes that he has a low level of suicidal ideation at baseline, but denies any plans for suicide, and does not feel a threat to himself. - Continue trazodone - Follow-up with psychiatrist, about switching stimulants - Improve sleep hygiene - Exercise 3 to 5 days a week

## 2023-11-05 ENCOUNTER — Other Ambulatory Visit: Payer: Self-pay | Admitting: Student

## 2023-11-07 DIAGNOSIS — F4010 Social phobia, unspecified: Secondary | ICD-10-CM | POA: Diagnosis not present

## 2023-11-07 DIAGNOSIS — G2581 Restless legs syndrome: Secondary | ICD-10-CM | POA: Diagnosis not present

## 2023-11-07 DIAGNOSIS — G4709 Other insomnia: Secondary | ICD-10-CM | POA: Diagnosis not present

## 2023-11-07 DIAGNOSIS — F900 Attention-deficit hyperactivity disorder, predominantly inattentive type: Secondary | ICD-10-CM | POA: Diagnosis not present

## 2023-12-05 DIAGNOSIS — G4700 Insomnia, unspecified: Secondary | ICD-10-CM | POA: Diagnosis not present

## 2023-12-05 DIAGNOSIS — F900 Attention-deficit hyperactivity disorder, predominantly inattentive type: Secondary | ICD-10-CM | POA: Diagnosis not present

## 2023-12-05 DIAGNOSIS — G2581 Restless legs syndrome: Secondary | ICD-10-CM | POA: Diagnosis not present

## 2023-12-05 DIAGNOSIS — F4010 Social phobia, unspecified: Secondary | ICD-10-CM | POA: Diagnosis not present

## 2023-12-26 ENCOUNTER — Encounter: Payer: Self-pay | Admitting: Family Medicine

## 2023-12-26 ENCOUNTER — Ambulatory Visit: Payer: Medicaid Other | Admitting: Family Medicine

## 2023-12-26 VITALS — BP 113/78 | HR 89 | Ht 67.0 in | Wt 251.8 lb

## 2023-12-26 DIAGNOSIS — Z789 Other specified health status: Secondary | ICD-10-CM

## 2023-12-26 DIAGNOSIS — F649 Gender identity disorder, unspecified: Secondary | ICD-10-CM

## 2023-12-26 DIAGNOSIS — G4700 Insomnia, unspecified: Secondary | ICD-10-CM | POA: Diagnosis not present

## 2023-12-26 DIAGNOSIS — F322 Major depressive disorder, single episode, severe without psychotic features: Secondary | ICD-10-CM

## 2023-12-26 MED ORDER — GABAPENTIN 600 MG PO TABS: 600.0000 mg | ORAL_TABLET | Freq: Three times a day (TID) | ORAL | Status: AC

## 2023-12-26 MED ORDER — TESTOSTERONE CYPIONATE 200 MG/ML IM SOLN
INTRAMUSCULAR | 4 refills | Status: AC
Start: 2023-12-26 — End: ?

## 2023-12-26 MED ORDER — MELOXICAM 15 MG PO TABS: 15.0000 mg | ORAL_TABLET | Freq: Every day | ORAL | 1 refills | Status: AC

## 2023-12-26 MED ORDER — DULOXETINE HCL 30 MG PO CPEP: 30.0000 mg | ORAL_CAPSULE | Freq: Every day | ORAL | Status: AC

## 2023-12-26 MED ORDER — "BD BLUNT FILL NEEDLE 18G X 1-1/2"" MISC": 12 refills | Status: AC

## 2023-12-26 NOTE — Patient Instructions (Signed)
 MyChart me about your knee in a couple of weeks. I have sent referral for sleep study. Great to see you!

## 2023-12-28 ENCOUNTER — Encounter: Payer: Self-pay | Admitting: Family Medicine

## 2023-12-28 NOTE — Progress Notes (Signed)
    CHIEF COMPLAINT / HPI:   Follow-up gender affirming hormone therapy.  Doing well.  Not having any issues that are new related to his hormone therapy. 2.  Follows up with counseling for depressive symptoms.  Reports these are stable. 3.  Still concerned about not getting appropriate sleep.  Difficulty falling asleep and staying asleep.  I thought we had set up a sleep study but somehow that never got completed.  No daytime headache.  Energy is low though  PERTINENT  PMH / PSH: I have reviewed the patient's medications, allergies, past medical and surgical history, smoking status and updated in the EMR as appropriate.   OBJECTIVE:  BP 113/78   Pulse 89   Ht 5' 7 (1.702 m)   Wt 251 lb 12.8 oz (114.2 kg)   SpO2 97%   BMI 39.44 kg/m  PSYCH: AxOx4. Good eye contact.. No psychomotor retardation or agitation. Appropriate speech fluency and content. Asks and answers questions appropriately. Mood is congruent.   ASSESSMENT / PLAN:   Male-to-male transgender person Will get labs today.  Will adjust based on levels.  From a transition standpoint, he seems to be doing well.  Refills given  MDD (major depressive disorder) He continues to follow with psychiatry and his therapist.  Insomnia Will do the referral again.  Not sure what happened.   Camie Mulch MD

## 2023-12-28 NOTE — Assessment & Plan Note (Signed)
 Will do the referral again.  Not sure what happened.

## 2023-12-28 NOTE — Assessment & Plan Note (Signed)
 Will get labs today.  Will adjust based on levels.  From a transition standpoint, he seems to be doing well.  Refills given

## 2023-12-28 NOTE — Assessment & Plan Note (Addendum)
 He continues to follow with psychiatry and his therapist.  He has had some med changes so I updated the chart today.  Notably they discontinued medication for ADHD which she thought was perhaps making his insomnia worse.  Unfortunately has not seen much improvement with that change.

## 2024-01-03 ENCOUNTER — Telehealth: Payer: Medicaid Other | Admitting: Physician Assistant

## 2024-01-03 DIAGNOSIS — R3989 Other symptoms and signs involving the genitourinary system: Secondary | ICD-10-CM | POA: Diagnosis not present

## 2024-01-03 MED ORDER — CEPHALEXIN 500 MG PO CAPS: 500.0000 mg | ORAL_CAPSULE | Freq: Two times a day (BID) | ORAL | 0 refills | Status: AC

## 2024-01-03 NOTE — Progress Notes (Signed)
I have spent 5 minutes in review of e-visit questionnaire, review and updating patient chart, medical decision making and response to patient.   Mia Milan Cody Jacklynn Dehaas, PA-C    

## 2024-01-03 NOTE — Progress Notes (Signed)

## 2024-01-07 IMAGING — MR MR KNEE*L* W/O CM
4 of 7 series · 22 of 40 positions shown · non-contrast
Comparison: Radiographs 01/12/2022

CLINICAL DATA: Progressive knee pain medially.

EXAM:
MRI OF THE LEFT KNEE WITHOUT CONTRAST
TECHNIQUE: Multiplanar, multisequence MR imaging of the knee was performed. No
intravenous contrast was administered.

[Series 3: T2 fat-sat · axial · 4.0mm · 0.50mm/px · z∈[-50,+65]mm · 5 of 24 slices shown]
[im 1/24]
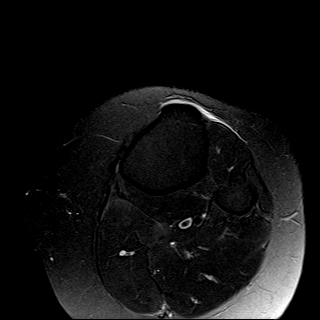
[im 6/24]
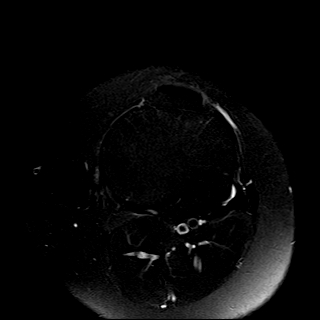
[im 12/24]
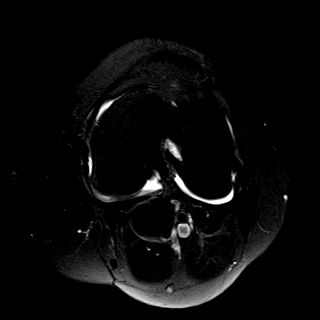
[im 18/24]
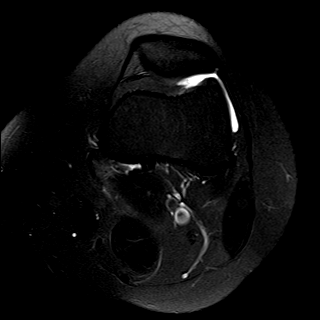
[im 24/24]
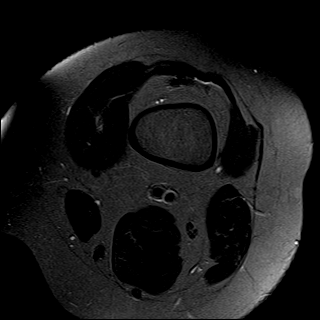

[Series 7: PD fat-sat · sagittal · 3.0mm · 0.29mm/px · 7 of 27 slices shown (1 of 3)]
[im 1/27]
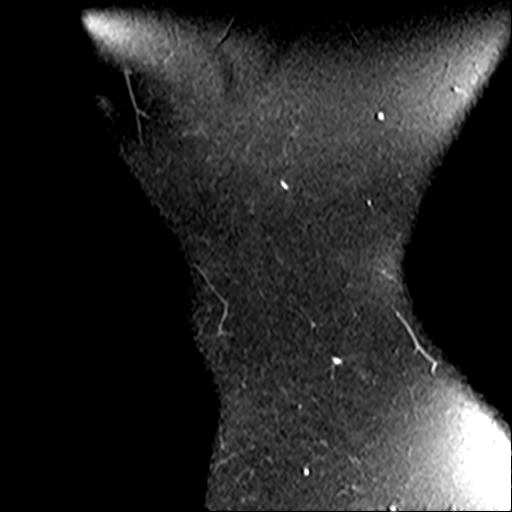
[im 5/27]
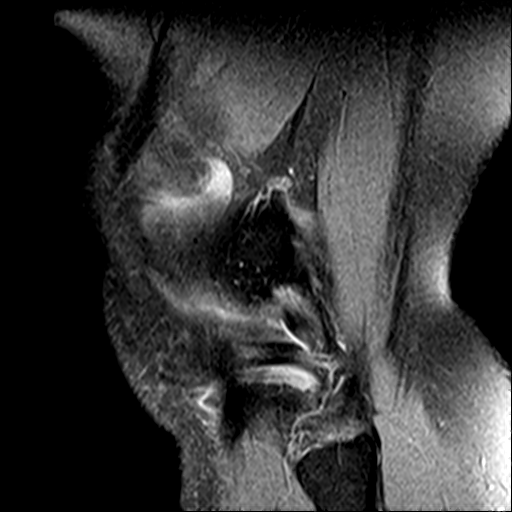
[im 9/27]
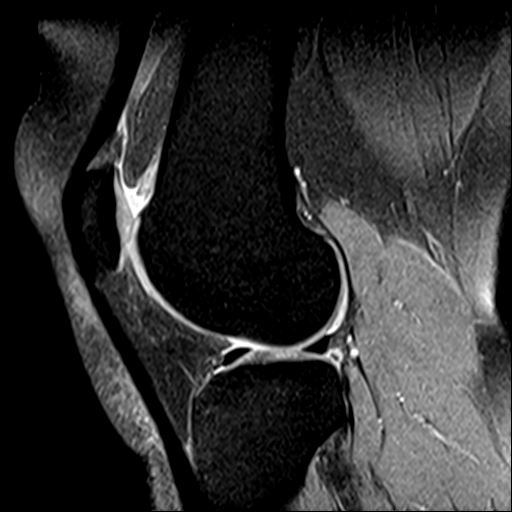
[im 14/27]
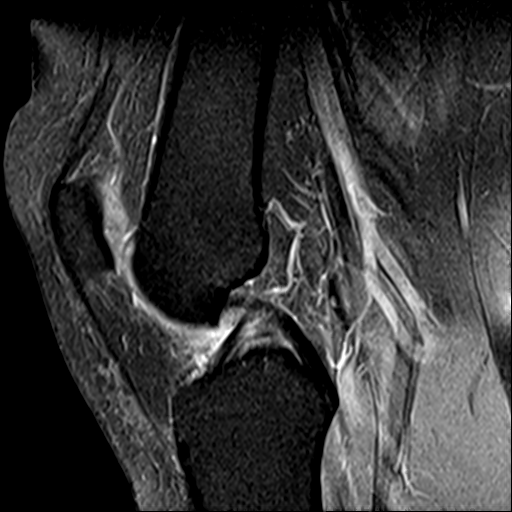
[im 18/27]
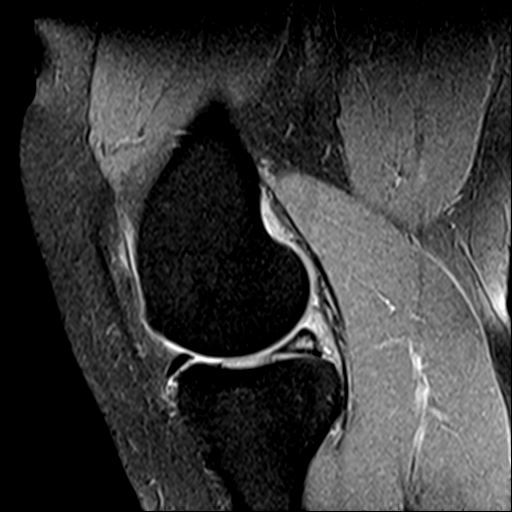
[im 22/27]
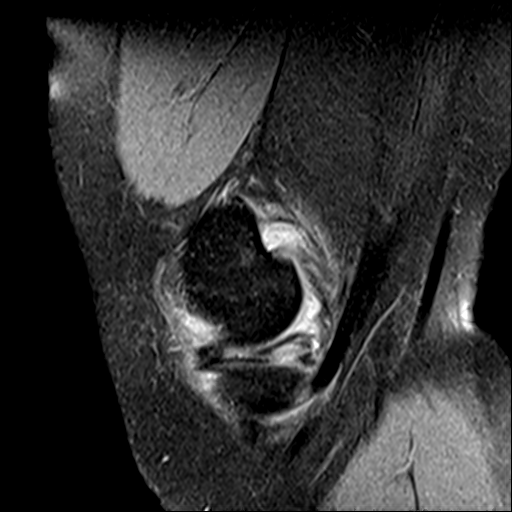
[im 27/27]
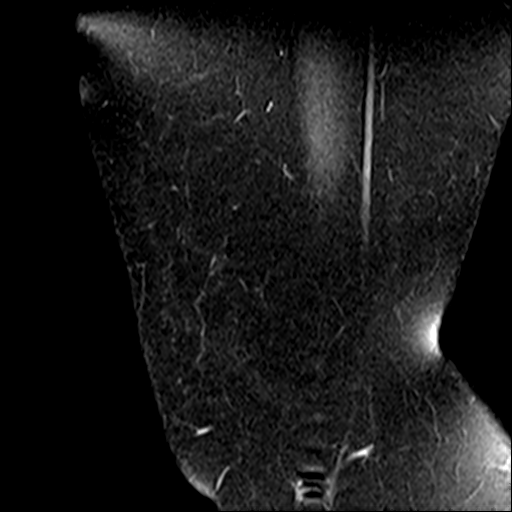

[Series 8: PD fat-sat · coronal · 3.0mm · 0.29mm/px · 7 of 28 slices shown (2 of 3)]
[im 1/28]
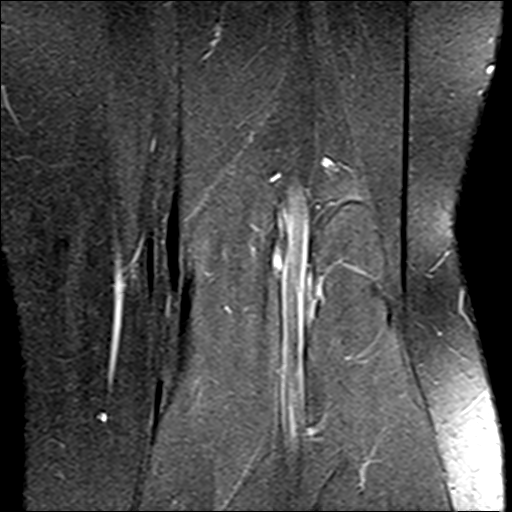
[im 5/28]
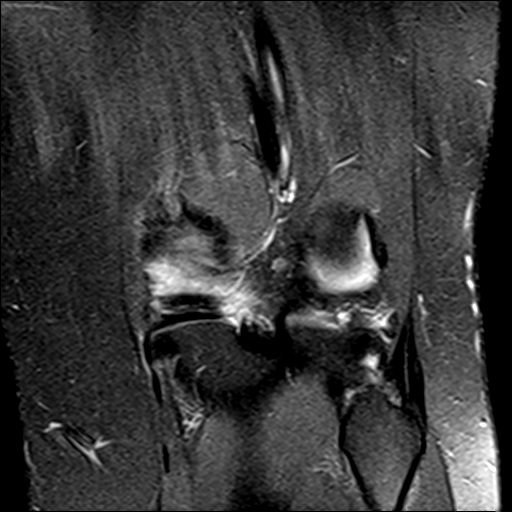
[im 10/28]
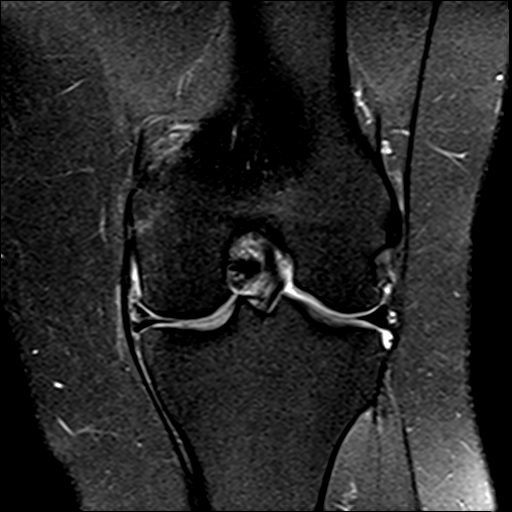
[im 14/28]
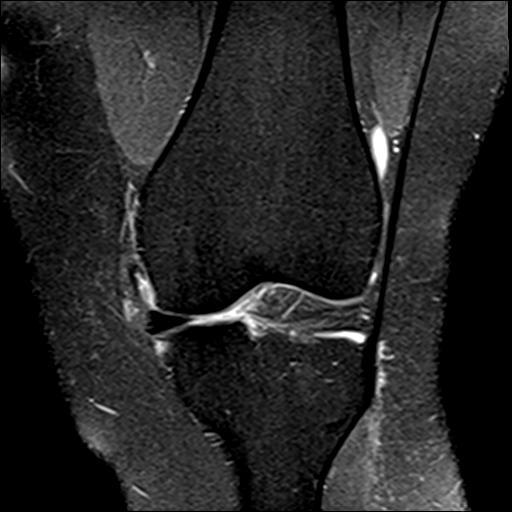
[im 19/28]
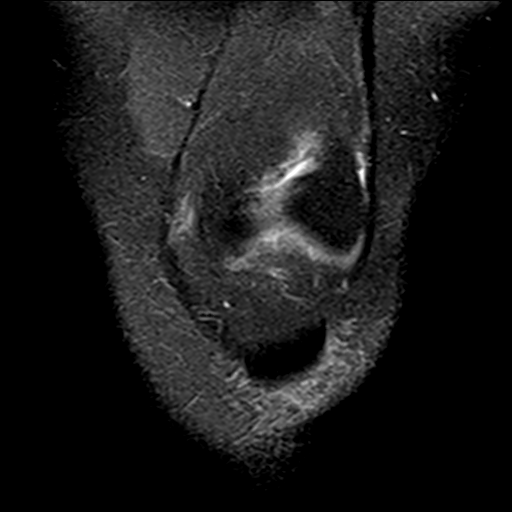
[im 23/28]
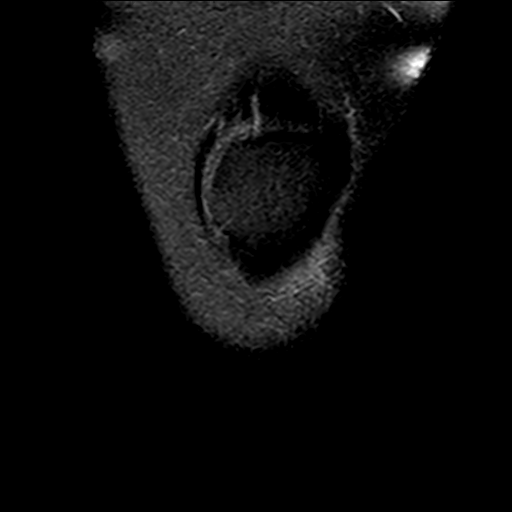
[im 28/28]
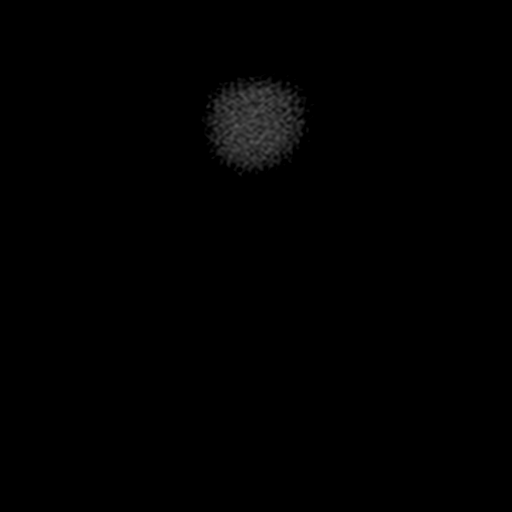

[Series 9: PD fat-sat · coronal · 2.0mm · 0.29mm/px · 3 of 11 slices shown (3 of 3)]
[im 1/11]
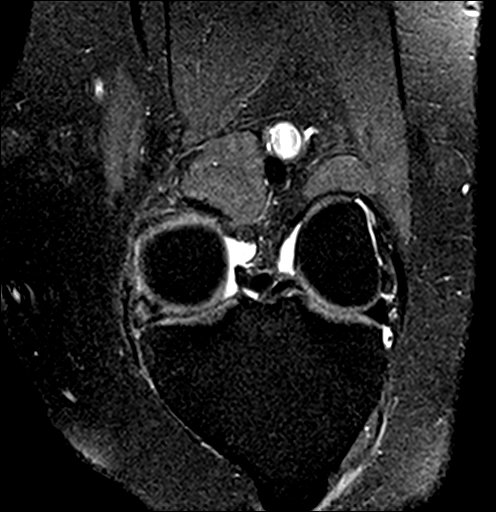
[im 6/11]
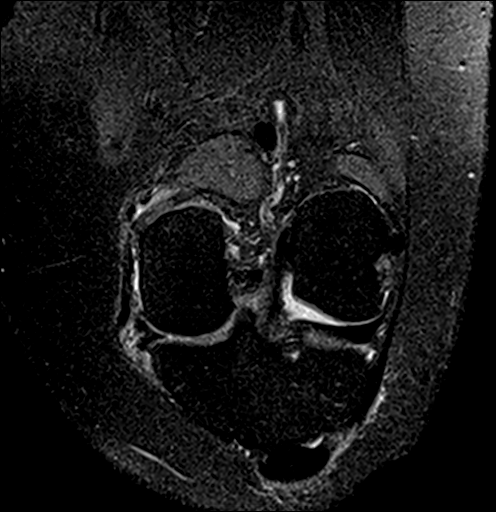
[im 11/11]
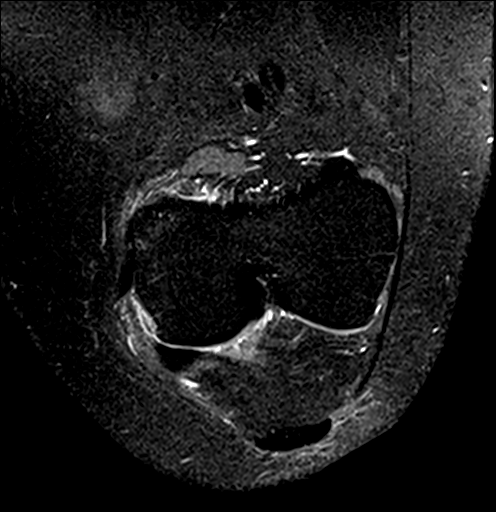

[22 of 40 positions shown; findings below may reference images not displayed]

FINDINGS: MENISCI

Medial meniscus: Complex macerated type tear involving the posterior
horn and midbody junction region. Adjacent parameniscal cyst versus
focal synovitis.

Lateral meniscus:  Intact

LIGAMENTS

Cruciates:  Intact

Collaterals:  Intact

CARTILAGE

Patellofemoral:  Normal

Medial:  Normal

Lateral:  Normal

Joint: Small amount of joint fluid without overt effusion. Medial
patellar plica is noted.

Popliteal Fossa:  No popliteal mass or Baker's cyst.

Extensor Mechanism: The patella retinacular structures are intact
and the quadriceps and patellar tendons are intact.

Bones:  No acute bony findings. No bone lesions.

Other: Unremarkable knee musculature.
IMPRESSION: 1. Complex macerated type tear involving the posterior horn and
midbody junction region of the medial meniscus.
2. Intact ligamentous structures and no acute bony findings.
3. No joint effusion or Baker's cyst.

## 2024-01-08 DIAGNOSIS — F4010 Social phobia, unspecified: Secondary | ICD-10-CM | POA: Diagnosis not present

## 2024-01-08 DIAGNOSIS — G2581 Restless legs syndrome: Secondary | ICD-10-CM | POA: Diagnosis not present

## 2024-01-08 DIAGNOSIS — F900 Attention-deficit hyperactivity disorder, predominantly inattentive type: Secondary | ICD-10-CM | POA: Diagnosis not present

## 2024-01-08 DIAGNOSIS — G4700 Insomnia, unspecified: Secondary | ICD-10-CM | POA: Diagnosis not present

## 2024-02-07 ENCOUNTER — Encounter (HOSPITAL_BASED_OUTPATIENT_CLINIC_OR_DEPARTMENT_OTHER): Payer: Self-pay

## 2024-02-19 DIAGNOSIS — G2581 Restless legs syndrome: Secondary | ICD-10-CM | POA: Diagnosis not present

## 2024-02-19 DIAGNOSIS — F4010 Social phobia, unspecified: Secondary | ICD-10-CM | POA: Diagnosis not present

## 2024-02-19 DIAGNOSIS — G4700 Insomnia, unspecified: Secondary | ICD-10-CM | POA: Diagnosis not present

## 2024-02-19 DIAGNOSIS — F900 Attention-deficit hyperactivity disorder, predominantly inattentive type: Secondary | ICD-10-CM | POA: Diagnosis not present

## 2024-03-18 DIAGNOSIS — G4700 Insomnia, unspecified: Secondary | ICD-10-CM | POA: Diagnosis not present

## 2024-03-18 DIAGNOSIS — G2581 Restless legs syndrome: Secondary | ICD-10-CM | POA: Diagnosis not present

## 2024-03-18 DIAGNOSIS — F900 Attention-deficit hyperactivity disorder, predominantly inattentive type: Secondary | ICD-10-CM | POA: Diagnosis not present

## 2024-03-18 DIAGNOSIS — F4010 Social phobia, unspecified: Secondary | ICD-10-CM | POA: Diagnosis not present

## 2024-03-30 NOTE — Progress Notes (Unsigned)
 03/31/24- 27 yoM for sleep evaluation courtesy of Violetta Grice, MD with concern of Insomnia, complicated by F2M Trans, Anxiety, Depression, ADHD, - Cymbalta, gabapentin,  Followed by [facility]                   Dr Andree Kayser had intended sleep study Body weight today-257 lbs Epworth score-2 No ENT surgery and no known heart or lung disease. Has not had urogenital gender surgery. Sleeps alone. Complains of difficulty initiating sleep and a persistent sense of "exhaustion" during the day, but does not fall asleep easily in class or driving.  Currently taking trazodone and gabapentin which help sleep some. Previously tried melatonin, doxepin,  lunesta, trazodone. Wakes several times after sleep onset and sometimes doesn't fall asleep at all.  Can't judge snoring or witnessed apneas. Legs burn at night and he has to move them trying to initiate sleep. Not aware of complex parasomnias or seizures. Usual bedtime 11:00-12MN, estimating 1 hours sleep latency, waking twice, and hoping to sleep until 8AM. Discussed the use of AI scribe software for clinical note transcription with the patient, who gave verbal consent to proceed  History of Present Illness   The patient, with a history of chronic insomnia and restless legs, presents with ongoing sleep disturbances. He reports difficulty falling asleep and staying asleep, waking up at least twice a night, and sometimes as early as 6 AM, even if he went to bed at midnight. The patient also reports a history of sleepwalking, which was more frequent during his childhood and teenage years but has since decreased.  The patient's restless legs primarily affect his thighs, causing a burning sensation that is relieved by movement. This symptom used to occur 24 hours a day but has improved and now mostly occurs at night. The patient has tried several medications for sleep, including trazodone, Lunesta, and doxepin, with variable success. He is currently taking trazodone,  which sometimes helps but can also exacerbate a history of bedwetting. The patient is also taking gabapentin for restless legs, but it doesn't seem to help much.  The patient has tried high-dose melatonin without success and has not had any surgeries in the nose or throat. He lives alone and is not aware if he snores. The patient reports feeling exhausted during the daytime, which may be due to medication effects or other factors.     Prior to Admission medications   Medication Sig Start Date End Date Taking? Authorizing Provider  busPIRone (BUSPAR) 30 MG tablet Take 30 mg by mouth daily.   Yes [provider]  DULoxetine (CYMBALTA) 30 MG capsule Take 1 capsule (30 mg total) by mouth daily. In addition to the 60 mg tab for a total of 90 mg  daily 12/26/23  Yes Charise Companion, MD  DULoxetine (CYMBALTA) 60 MG capsule Take 1 capsule (60 mg total) by mouth 2 (two) times daily. 02/15/21  Yes Valli Gaw, MD  gabapentin (NEURONTIN) 600 MG tablet Take 1 tablet (600 mg total) by mouth 3 (three) times daily. Patient taking differently: Take 600 mg by mouth daily. 12/26/23  Yes Charise Companion, MD  guanFACINE (INTUNIV) 2 MG TB24 ER tablet Take 2 mg by mouth daily.   Yes [provider]  meloxicam (MOBIC) 15 MG tablet Take 1 tablet (15 mg total) by mouth daily. Patient taking differently: Take 15 mg by mouth daily as needed for pain. 12/26/23  Yes Charise Companion, MD  NEEDLE, DISP, 18 G (B-D BLUNT FILL NEEDLE)  18G X 1-1/2" MISC Use as directed with testosterone 12/26/23  Yes Charise Companion, MD  NEEDLE, DISP, 25 G (BD SAFETYGLIDE NEEDLE) 25G X 5/8" MISC Use as directed 12/06/22  Yes Charise Companion, MD  Syringe, Disposable, (B-D SYRINGE LUER-LOK 1CC) 1 ML MISC Use as directed with testosterone injection 12/06/22  Yes Charise Companion, MD  testosterone cypionate (DEPOTESTOSTERONE CYPIONATE) 200 MG/ML injection Inject 60 mg subcutaneously each week 12/26/23  Yes Charise Companion, MD  vitamin B-12 (CYANOCOBALAMIN) 500 MCG  tablet Take 500 mcg by mouth daily.   Yes [provider]   Past Medical History:  Diagnosis Date   ADHD    Anxiety    Gender dysphoria    Past Surgical History:  Procedure Laterality Date   MASTECTOMY     History reviewed. No pertinent family history. Social History   Socioeconomic History   Marital status: Single    Spouse name: Not on file   Number of children: Not on file   Years of education: Not on file   Highest education level: Not on file  Occupational History   Not on file  Tobacco Use   Smoking status: Never   Smokeless tobacco: Never  Vaping Use   Vaping status: Never Used  Substance and Sexual Activity   Alcohol use: Yes    Comment: 1-2 times per month  (has been using more through pandemic)    Drug use: Never   Sexual activity: Never  Other Topics Concern   Not on file  Social History Narrative   Works at [employer], partner is Automotive engineer    Social Drivers of Corporate investment banker Strain: Not on [employer] Insecurity: Low Risk  (10/04/2023)   Received from [facility]   Hunger Vital Sign    Worried About Programme researcher, broadcasting/film/video in the Last Year: Never true    Ran Out of Food in the Last Year: Never true  Transportation Needs: No Transportation Needs (10/04/2023)   Received from [facility]    In the past 12 months, has lack of reliable transportation kept you from medical appointments, meetings, work or from getting things needed for daily living? : No  Physical Activity: Not on file  Stress: Not on file  Social Connections: Not on file  Intimate Partner Violence: Not on file   Assessment and Plan:   Exam consistent with OSA risk Insomnia Chronic insomnia with variable response to trazodone and minimal response to gabapentin. Possible sleep apnea requires evaluation. - Order in-center sleep study for sleep apnea and other disorders. - Continue trazodone and gabapentin. - Discuss insurance approval for  in-center study.  Restless Legs Syndrome (RLS) Nocturnal burning in thighs relieved by movement. Negative for iron deficiency anemia and thyroid dysfunction. High-dose melatonin ineffective. - Evaluate RLS during in-center sleep study.  Enuresis Bedwetting possibly linked to trazodone and sleep disturbances. - Consider medication adjustments if enuresis persists.  Sleepwalking Childhood and adolescent sleepwalking episodes with ongoing concerns. - Monitor sleepwalking during in-center sleep study.  Follow-up Prefers in-center study for comprehensive evaluation. - Schedule follow-up post-sleep study results. - Contact if results not communicated within two weeks post-study.        ROS-see HPI   + = positive Constitutional:    weight loss, night sweats, fevers, chills, +fatigue, lassitude. HEENT:    headaches, difficulty swallowing, tooth/dental problems, sore throat,       sneezing, itching, ear ache, nasal congestion, post nasal  drip, snoring CV:    chest pain, orthopnea, PND, swelling in lower extremities, anasarca,                                   dizziness, palpitations Resp:   shortness of breath with exertion or at rest.                productive cough,   non-productive cough, coughing up of blood.              change in color of mucus.  wheezing.   Skin:    rash or lesions. GI:  No-   heartburn, indigestion, abdominal pain, nausea, vomiting, diarrhea,                 change in bowel habits, loss of appetite GU: dysuria, change in color of urine, no urgency or frequency.   flank pain. MS:   joint pain, stiffness, decreased range of motion, back pain. Neuro-     nothing unusual Psych:  change in mood or affect.  depression or +anxiety.   memory loss.  OBJ- Physical Exam General- Alert, Oriented, Affect-appropriate, Distress- none acute, +obese Skin- rash-none, lesions- none, excoriation- none Lymphadenopathy- none Head- atraumatic            Eyes- Gross vision intact,  PERRLA, conjunctivae and secretions clear            Ears- Hearing, canals-normal            Nose- Clear, no-Septal dev, mucus, polyps, erosion, perforation             Throat- Mallampati III-IV , mucosa clear , drainage- none, tonsils- atrophic, +teeth Neck- flexible , trachea midline, no stridor , thyroid nl, carotid no bruit Chest - symmetrical excursion , unlabored           Heart/CV- RRR , no murmur , no gallop  , no rub, nl s1 s2                           - JVD- none , edema- none, stasis changes- none, varices- none           Lung- clear to P&A, wheeze- none, cough- none , dullness-none, rub- none           Chest wall-  Abd-  Br/ Gen/ Rectal- Not done, not indicated Extrem- cyanosis- none, clubbing, none, atrophy- none, strength- nl Neuro- grossly intact to observation  History of Present Illness

## 2024-03-31 ENCOUNTER — Ambulatory Visit: Payer: Medicaid Other | Admitting: Internal Medicine

## 2024-03-31 ENCOUNTER — Encounter: Payer: Self-pay | Admitting: Internal Medicine

## 2024-03-31 VITALS — BP 114/72 | HR 122 | Temp 98.8°F | Ht 67.0 in | Wt 257.0 lb

## 2024-03-31 DIAGNOSIS — G4761 Periodic limb movement disorder: Secondary | ICD-10-CM | POA: Diagnosis not present

## 2024-03-31 DIAGNOSIS — G4700 Insomnia, unspecified: Secondary | ICD-10-CM | POA: Diagnosis not present

## 2024-03-31 DIAGNOSIS — R5383 Other fatigue: Secondary | ICD-10-CM

## 2024-03-31 NOTE — Patient Instructions (Signed)
 Order- schedule Split night in-center sleep study dx insomnia with sleep apnea, limb movement sleep disorder   Call us  about 2 weeks after your study for results and recommendations

## 2024-04-01 ENCOUNTER — Encounter: Payer: Self-pay | Admitting: Internal Medicine

## 2024-04-15 DIAGNOSIS — F900 Attention-deficit hyperactivity disorder, predominantly inattentive type: Secondary | ICD-10-CM | POA: Diagnosis not present

## 2024-04-15 DIAGNOSIS — F4010 Social phobia, unspecified: Secondary | ICD-10-CM | POA: Diagnosis not present

## 2024-04-15 DIAGNOSIS — G4700 Insomnia, unspecified: Secondary | ICD-10-CM | POA: Diagnosis not present

## 2024-04-15 DIAGNOSIS — G2581 Restless legs syndrome: Secondary | ICD-10-CM | POA: Diagnosis not present

## 2024-04-16 ENCOUNTER — Telehealth: Payer: Self-pay | Admitting: Internal Medicine

## 2024-04-16 DIAGNOSIS — G4733 Obstructive sleep apnea (adult) (pediatric): Secondary | ICD-10-CM

## 2024-04-16 NOTE — Telephone Encounter (Signed)
 Insurance denies in-center sleep test. Instead ordering HST for dx OSA

## 2024-04-16 NOTE — Telephone Encounter (Signed)
 [employer] has denied the authorization for the in lab sleep study   Reasoning:  Medical Letter Rationale [id number] - Your doctor requested a test [phone], polysomnography; age 27 years or older, sleep staging with 4 or more additional parameters of sleep, with initiation of continuous positive airway pressure therapy or bilevel ventilation, attended by a technologist). We see you have problems sleeping (insomnia). You are obese. We cannot approve this request. This test must be done after you have had a sleep study (home or in-lab). The sleep study must show you have (obstructive sleep apnea, OSA). Symptoms of this condition could include snoring or observed pauses in breathing during sleep. Our records do not tell us  that you have had a sleep study nor a diagnosis of OSA. If you have been diagnosed with OSA, then your doctor can ask for another follow-up test first (APAP, unless there is a contraindication). You have the right to appeal this decision. Please see enclosed notice of your rights. Please call your doctor with any questions. We based our decision on guidelines: [employer] Management. [employer] Clinical Appropriateness Guidelines. Sleep Disorder Management Appropriate Use Criteria: Diagnostic and Treatment Management. Type of Review 95811-1 - Medical Letter Rationale [phone] - Your doctor requested a test [phone], polysomnography; age 27 years or older, sleep staging with 4 or more additional parameters of sleep, with initiation of continuous positive airway pressure therapy or bilevel ventilation, attended by a technologist). We see you have problems sleeping (insomnia). You are obese. We cannot approve this request. This test must be done after you have had a sleep study (home or in-lab). The sleep study must show you have (obstructive sleep apnea, OSA). Symptoms of this condition could include snoring or observed pauses in breathing during sleep. Our records do not tell us  that you have had  a sleep study nor a diagnosis of OSA. If you have been diagnosed with OSA, then your doctor can ask for another follow-up test first (APAP, unless there is a contraindication). You have the right to appeal this decision. Please see enclosed notice of your rights. Please call your doctor with any questions. We based our decision on guidelines: [employer] Management. [employer] Clinical Appropriateness Guidelines. Sleep Disorder Management Appropriate Use Criteria: Diagnostic and Treatment Management.  If you would like to submit an appeal, we can do so through [employer] or possibly submit a request for HST

## 2024-04-28 NOTE — Addendum Note (Signed)
 Addended byRefugio Cantor M on: 04/28/2024 01:57 PM   Modules accepted: Orders

## 2024-04-28 NOTE — Telephone Encounter (Signed)
 Hey Dr.Young, Home Sleep Test needs a referral to schedule. When you get the chance please add the Hst referral. Thank you!

## 2024-04-29 NOTE — Telephone Encounter (Signed)
 Hello Tony Sharp!  Did I put the HST referral in correctly?  Let me know.  Thank you!  Sheina Mcleish

## 2024-05-19 DIAGNOSIS — F4010 Social phobia, unspecified: Secondary | ICD-10-CM | POA: Diagnosis not present

## 2024-05-19 DIAGNOSIS — F900 Attention-deficit hyperactivity disorder, predominantly inattentive type: Secondary | ICD-10-CM | POA: Diagnosis not present

## 2024-05-19 DIAGNOSIS — G2581 Restless legs syndrome: Secondary | ICD-10-CM | POA: Diagnosis not present

## 2024-05-19 DIAGNOSIS — G4700 Insomnia, unspecified: Secondary | ICD-10-CM | POA: Diagnosis not present

## 2024-06-02 ENCOUNTER — Telehealth: Admitting: Physician Assistant

## 2024-06-02 ENCOUNTER — Encounter

## 2024-06-02 DIAGNOSIS — R3989 Other symptoms and signs involving the genitourinary system: Secondary | ICD-10-CM

## 2024-06-02 MED ORDER — CEPHALEXIN 500 MG PO CAPS: 500.0000 mg | ORAL_CAPSULE | Freq: Two times a day (BID) | ORAL | 0 refills | Status: DC

## 2024-06-02 NOTE — Progress Notes (Signed)

## 2024-06-09 NOTE — Telephone Encounter (Signed)
 Patient does not want to proceed with Home Sleep Test. He moved and wants to have follow-up canceled and Home Sleep Test. I will cancel these appt.

## 2024-06-24 DIAGNOSIS — R399 Unspecified symptoms and signs involving the genitourinary system: Secondary | ICD-10-CM | POA: Diagnosis not present

## 2024-06-30 ENCOUNTER — Ambulatory Visit: Admitting: Internal Medicine

## 2024-06-30 DIAGNOSIS — G4700 Insomnia, unspecified: Secondary | ICD-10-CM | POA: Diagnosis not present

## 2024-06-30 DIAGNOSIS — F900 Attention-deficit hyperactivity disorder, predominantly inattentive type: Secondary | ICD-10-CM | POA: Diagnosis not present

## 2024-06-30 DIAGNOSIS — F4010 Social phobia, unspecified: Secondary | ICD-10-CM | POA: Diagnosis not present

## 2024-06-30 DIAGNOSIS — G2581 Restless legs syndrome: Secondary | ICD-10-CM | POA: Diagnosis not present

## 2025-01-19 ENCOUNTER — Telehealth: Admitting: Nurse Practitioner

## 2025-01-19 DIAGNOSIS — R399 Unspecified symptoms and signs involving the genitourinary system: Secondary | ICD-10-CM

## 2025-01-19 DIAGNOSIS — N3000 Acute cystitis without hematuria: Secondary | ICD-10-CM

## 2025-01-19 MED ORDER — CEPHALEXIN 500 MG PO CAPS: 500.0000 mg | ORAL_CAPSULE | Freq: Two times a day (BID) | ORAL | 0 refills | Status: AC

## 2025-01-19 NOTE — Addendum Note (Signed)
 Addended by: Chevelle Durr V on: 01/19/2025 11:00 AM   Modules accepted: Orders, Level of Service

## 2025-01-19 NOTE — Progress Notes (Addendum)
 Transgender male; will treat for acute uncomplicated cystitis via E visit  We are sorry that you are not feeling well.  Here is how we plan to help!  Based on what you shared with me it looks like you most likely have a simple urinary tract infection.  A UTI (Urinary Tract Infection) is a bacterial infection of the bladder.  Most cases of urinary tract infections are simple to treat but a key part of your care is to encourage you to drink plenty of fluids and watch your symptoms carefully.  I have prescribed Keflex  500 mg twice a day for 7 days.  Your symptoms should gradually improve. Call us  if the burning in your urine worsens, you develop worsening fever, back pain or pelvic pain or if your symptoms do not resolve after completing the antibiotic.  Urinary tract infections can be prevented by drinking plenty of water to keep your body hydrated.  Also be sure when you wipe, wipe from front to back and don't hold it in!  If possible, empty your bladder every 4 hours.  Your e-visit answers were reviewed by a board certified advanced clinical practitioner to complete your personal care plan.  Depending on the condition, your plan could have included both over the counter or prescription medications.  If there is a problem please reply  once you have received a response from your provider.  Your safety is important to us .  If you have drug allergies check your prescription carefully.    You can use MyChart to ask questions about todays visit, request a non-urgent call back, or ask for a work or school excuse for 24 hours related to this e-Visit. If it has been greater than 24 hours you will need to follow up with your provider, or enter a new e-Visit to address those concerns.   You will get an e-mail in the next two days asking about your experience.  I hope that your e-visit has been valuable and will speed your recovery. Thank you for using e-visits.  I have spent 5 minutes in review of  e-visit questionnaire, review and updating patient chart, medical decision making and response to patient.   Tony LULLA Rouleau, NP

## 2025-01-19 NOTE — Addendum Note (Signed)
 Addended by: Keyia Moretto V on: 01/19/2025 01:49 PM   Modules accepted: Orders
